# Patient Record
Sex: Male | Born: 1965 | Race: White | Hispanic: No | Marital: Married | Smoking: Never smoker
Health system: Southern US, Community
[De-identification: ages and names within clinical notes are randomized; demographics above are authoritative.]

## PROBLEM LIST (undated history)

## (undated) DIAGNOSIS — Z8619 Personal history of other infectious and parasitic diseases: Secondary | ICD-10-CM

## (undated) DIAGNOSIS — T7840XA Allergy, unspecified, initial encounter: Secondary | ICD-10-CM

## (undated) DIAGNOSIS — K5090 Crohn's disease, unspecified, without complications: Secondary | ICD-10-CM

## (undated) DIAGNOSIS — L719 Rosacea, unspecified: Secondary | ICD-10-CM

## (undated) HISTORY — PX: COLONOSCOPY: SHX174

## (undated) HISTORY — DX: Personal history of other infectious and parasitic diseases: Z86.19

## (undated) HISTORY — DX: Crohn's disease, unspecified, without complications: K50.90

## (undated) HISTORY — DX: Allergy, unspecified, initial encounter: T78.40XA

---

## 1991-01-20 HISTORY — PX: BOWEL RESECTION: SHX1257

## 2007-10-11 DIAGNOSIS — D239 Other benign neoplasm of skin, unspecified: Secondary | ICD-10-CM

## 2007-10-11 HISTORY — DX: Other benign neoplasm of skin, unspecified: D23.9

## 2009-10-31 ENCOUNTER — Other Ambulatory Visit: Payer: Self-pay | Admitting: Internal Medicine

## 2014-08-08 ENCOUNTER — Telehealth: Payer: Self-pay | Admitting: Family Medicine

## 2014-08-08 ENCOUNTER — Ambulatory Visit (INDEPENDENT_AMBULATORY_CARE_PROVIDER_SITE_OTHER): Payer: 59 | Admitting: Family Medicine

## 2014-08-08 ENCOUNTER — Encounter: Payer: Self-pay | Admitting: Family Medicine

## 2014-08-08 ENCOUNTER — Encounter (INDEPENDENT_AMBULATORY_CARE_PROVIDER_SITE_OTHER): Payer: Self-pay

## 2014-08-08 VITALS — BP 124/80 | HR 92 | Temp 99.1°F | Ht 68.0 in | Wt 166.0 lb

## 2014-08-08 DIAGNOSIS — K5090 Crohn's disease, unspecified, without complications: Secondary | ICD-10-CM | POA: Diagnosis not present

## 2014-08-08 DIAGNOSIS — Z720 Tobacco use: Secondary | ICD-10-CM | POA: Diagnosis not present

## 2014-08-08 DIAGNOSIS — N529 Male erectile dysfunction, unspecified: Secondary | ICD-10-CM | POA: Diagnosis not present

## 2014-08-08 DIAGNOSIS — Z0000 Encounter for general adult medical examination without abnormal findings: Secondary | ICD-10-CM | POA: Insufficient documentation

## 2014-08-08 DIAGNOSIS — L719 Rosacea, unspecified: Secondary | ICD-10-CM | POA: Diagnosis not present

## 2014-08-08 DIAGNOSIS — Z13220 Encounter for screening for lipoid disorders: Secondary | ICD-10-CM | POA: Diagnosis not present

## 2014-08-08 LAB — LIPID PANEL
Cholesterol: 154 mg/dL (ref 0–200)
HDL: 53.2 mg/dL (ref >39.00)
LDL Cholesterol: 69 mg/dL (ref 0–99)
NonHDL: 100.8
Total CHOL/HDL Ratio: 3
Triglycerides: 157 mg/dL — ABNORMAL HIGH (ref 0.0–149.0)
VLDL: 31.4 mg/dL (ref 0.0–40.0)

## 2014-08-08 MED ORDER — TADALAFIL 5 MG PO TABS: 5.0000 mg | ORAL_TABLET | Freq: Every day | ORAL | Status: DC

## 2014-08-08 MED ORDER — NICOTINE 21 MG/24HR TD PT24: 21.0000 mg | MEDICATED_PATCH | Freq: Every day | TRANSDERMAL | Status: DC

## 2014-08-08 MED ORDER — SILDENAFIL CITRATE 50 MG PO TABS: 50.0000 mg | ORAL_TABLET | Freq: Every day | ORAL | Status: DC | PRN

## 2014-08-08 NOTE — Telephone Encounter (Signed)
Pt given triage sheet to explain medication change. Sheet given to Latoya/msn

## 2014-08-08 NOTE — Patient Instructions (Signed)
It was nice to see you today.  I have prescribed a medication for you (its at the pharmacy).  Use the patch (21 mg patch) and gum or losenge (2-4 mg) to help you quit using tobacco.  We will be in touch regarding your lab work and your referral to GI.  Follow up annually or sooner if needed.   Take care  Dr. Rica Mast

## 2014-08-08 NOTE — Assessment & Plan Note (Addendum)
Approximate nicotine content of his dip is 5 mg/g. Patient is therefore getting a lot of nicotine daily. Starting patient on 21 mg nicotine patch.  Patient to also use 4 mg gum as well.

## 2014-08-08 NOTE — Assessment & Plan Note (Signed)
Currently well controlled. Patient however has not been seen by GI physician in years. Patient is at high risk colon cancer given his history of Crohn's disease. Placing referral today to gastroenterology so he can establish care and obtain regular screening colonoscopies.

## 2014-08-08 NOTE — Progress Notes (Signed)
Subjective:    Patient ID: Alexander Bishop, male    DOB: [birth year], 49 y.o.   MRN: [id number]  HPI 49 year old male presents to the clinic today to establish care. Patient has a few complaints/concerns today: Tobacco cessation, Erectile dysfunction.   1) Preventative healthcare  Patient has not had a colonoscopy in years and is high risk. Patient is therefore need of colonoscopy.  Given age and family history patient is in need of lipid screening.  2) Tobacco abuse  Patient is a long time user of smokeless tobacco; Patient uses 1 can of dip daily [facility]).  He is aware of the risk and would like to discuss options regarding how to quit today.  He has failed prior attempts at quitting. He previously used nicorette gum.  3) ED  Patient reports a few year history of erectile dysfunction.  Patient reports that he intermittently has difficulty obtaining and maintaining an erection.  He says that this happens about 50% of time.  He has tried agents in the past with success.  No known exacerbating factors.  Patient has no issues with sexual desire/libido.  4) Crohns disease  Patient has history of Crohn's disease and had a partial colectomy in 1993.  He is currently in remission.  He does not have a gastroenterologist and has not received a screening colonoscopy in years.  5) Rosacea   Stable and well-controlled on doxycycline.  PMH, Surgical Hx, Family Hx, Social History reviewed and updated as below.  Past Medical History  Diagnosis Date  . History of chicken pox   . Allergy   . Crohn disease    Past Surgical History  Procedure Laterality Date  . Bowel resection  1993   Family History  Problem Relation Age of Onset  . Hyperlipidemia Father   . Heart disease Father   . Arthritis Paternal Grandmother   . Arthritis Paternal Grandfather    History   Social History  . Marital Status: Married    Spouse Name: N/A  . Number of Children: N/A   . Years of Education: N/A   Social History Main Topics  . Smoking status: Never Smoker   . Smokeless tobacco: Current User    Types: Snuff     Comment: Grizzly wintergreen Long cut; 1 can/day.   . Alcohol Use: 0.0 oz/week    0 Standard drinks or equivalent per week     Comment: Drinks ~ 8 beers on the weekends.   . Drug Use: No  . Sexual Activity: Yes     Comment: Married.    Other Topics Concern  . None   Social History Games developer; Nurse, children's.   Married.   Daughter 71 years old; Going to [employer].    Son 15   Review of Systems Positive for Diarrhea, Erectile dysfunction.  All other systems negative.     Objective:   Physical Exam Filed Vitals:   08/08/14 1413  BP: 124/80  Pulse: 92  Temp: 99.1 F (37.3 C)   Vital signs reviewed. Exam: Constitutional: well nourished, well developed 49 year old male in no acute distress. Eyes: No scleral icterus. No injection of conjunctiva. EOMI.  ENT: NCAT. Oropharynx clear. No exudate. MMM. Normal TMs bilaterally. Mouth: Good dentition. Neck:  Tracheal midline; No lymphadenopathy. No thyromegaly.  Lungs: No increased work of breathing. CTAB. No rales, rhonchi or wheezing.  CV: RRR, no murmur/rub/gallops. No LE edema.  Abdomen/GI: Soft, non-tender; nondistended. No masses or organomegly. No rebound  or guarding. Skin: Warm, dry, intact. No rash.  Psych: Normal mood and affect. AO x 3. Neuro: No focal deficits.     Assessment & Plan:  See Problem List

## 2014-08-08 NOTE — Assessment & Plan Note (Signed)
Unclear etiology at this time. Lack of abrupt onset is against psychogenic causes although this may be playing a role. No physical exam findings suggestive of underlying hypogonadism, thyroid disease. Patient does not have a history of diabetes or hypertension. No clinical evidence of cardio vascular disease as patient has good pulses and exercises on a regular basis without difficulty. Patient has responded previously to phosphodiesterase inhibitors. Given the above, will proceed with empiric treatment at this time. Rx sent for Cialis.

## 2014-08-08 NOTE — Assessment & Plan Note (Signed)
Sending to GI regarding colonoscopy. Lipid panel today.

## 2014-08-08 NOTE — Assessment & Plan Note (Signed)
Well-controlled on doxycycline. Patient to continue current therapy with this medication.

## 2014-08-09 ENCOUNTER — Encounter: Payer: Self-pay | Admitting: *Deleted

## 2014-08-09 ENCOUNTER — Encounter: Payer: Self-pay | Admitting: Family Medicine

## 2014-08-09 LAB — BASIC METABOLIC PANEL WITH GFR
BUN: 9 mg/dL (ref 6–23)
CALCIUM: 9.1 mg/dL (ref 8.4–10.5)
CO2: 25 meq/L (ref 19–32)
CREATININE: 1.12 mg/dL (ref 0.50–1.35)
Chloride: 102 mEq/L (ref 96–112)
GFR, Est African American: >89 mL/min
GFR, Est Non African American: 78 mL/min
Glucose, Bld: 81 mg/dL (ref 70–99)
POTASSIUM: 3.6 meq/L (ref 3.5–5.3)
SODIUM: 140 meq/L (ref 135–145)

## 2014-08-13 ENCOUNTER — Telehealth: Payer: Self-pay

## 2014-08-13 NOTE — Telephone Encounter (Signed)
Pt was called to ask about medical records release form and who was his previous primary. Dr.cook said that his medical records would not be needed at this time.

## 2014-11-30 ENCOUNTER — Encounter: Payer: Self-pay | Admitting: *Deleted

## 2014-12-03 ENCOUNTER — Ambulatory Visit: Payer: 59 | Admitting: Anesthesiology

## 2014-12-03 ENCOUNTER — Encounter
Admission: RE | Disposition: A | Discharge status: Home / Self Care | Payer: Self-pay | Source: Ambulatory Visit | Attending: Gastroenterology

## 2014-12-03 ENCOUNTER — Ambulatory Visit
Admission: RE | Admit: 2014-12-03 | Discharge: 2014-12-03 | Disposition: A | Discharge status: Home / Self Care | Payer: 59 | Source: Ambulatory Visit | Attending: Gastroenterology | Admitting: Gastroenterology

## 2014-12-03 DIAGNOSIS — K5090 Crohn's disease, unspecified, without complications: Secondary | ICD-10-CM | POA: Diagnosis not present

## 2014-12-03 DIAGNOSIS — L719 Rosacea, unspecified: Secondary | ICD-10-CM | POA: Diagnosis not present

## 2014-12-03 DIAGNOSIS — Z79899 Other long term (current) drug therapy: Secondary | ICD-10-CM | POA: Insufficient documentation

## 2014-12-03 DIAGNOSIS — Z1211 Encounter for screening for malignant neoplasm of colon: Secondary | ICD-10-CM | POA: Diagnosis present

## 2014-12-03 DIAGNOSIS — Z980 Intestinal bypass and anastomosis status: Secondary | ICD-10-CM | POA: Insufficient documentation

## 2014-12-03 HISTORY — PX: COLONOSCOPY WITH PROPOFOL: SHX5780

## 2014-12-03 HISTORY — DX: Rosacea, unspecified: L71.9

## 2014-12-03 SURGERY — COLONOSCOPY WITH PROPOFOL
Anesthesia: General

## 2014-12-03 MED ORDER — PROPOFOL 10 MG/ML IV BOLUS
INTRAVENOUS | Status: DC | PRN
  Administered 2014-12-03 (×2): 50 mg via INTRAVENOUS

## 2014-12-03 MED ORDER — SODIUM CHLORIDE 0.9 % IV SOLN
INTRAVENOUS | Status: DC
  Administered 2014-12-03 (×2): via INTRAVENOUS

## 2014-12-03 MED ORDER — MIDAZOLAM HCL 5 MG/5ML IJ SOLN
INTRAMUSCULAR | Status: DC | PRN
  Administered 2014-12-03: 1 mg via INTRAVENOUS

## 2014-12-03 MED ORDER — LIDOCAINE HCL (CARDIAC) 20 MG/ML IV SOLN
INTRAVENOUS | Status: DC | PRN
  Administered 2014-12-03: 30 mg via INTRAVENOUS

## 2014-12-03 MED ORDER — FENTANYL CITRATE (PF) 100 MCG/2ML IJ SOLN
INTRAMUSCULAR | Status: DC | PRN
  Administered 2014-12-03: 50 ug via INTRAVENOUS

## 2014-12-03 MED ORDER — PROPOFOL 500 MG/50ML IV EMUL
INTRAVENOUS | Status: DC | PRN
  Administered 2014-12-03: 160 ug/kg/min via INTRAVENOUS

## 2014-12-03 NOTE — Transfer of Care (Signed)
Immediate Anesthesia Transfer of Care Note  Patient: Alexander Bishop  Procedure(s) Performed: Procedure(s): COLONOSCOPY WITH PROPOFOL (N/A)  Patient Location: PACU and Short Stay  Anesthesia Type:General  Level of Consciousness: sedated and patient cooperative  Airway & Oxygen Therapy: Patient Spontanous Breathing and Patient connected to nasal cannula oxygen  Post-op Assessment: Report given to RN and Post -op Vital signs reviewed and stable  Post vital signs: Reviewed and stable  Last Vitals: There were no vitals filed for this visit.  Complications: No apparent anesthesia complications

## 2014-12-03 NOTE — H&P (Signed)
Outpatient short stay form Pre-procedure 12/03/2014 2:11 PM Lollie Sails MD  Primary Physician: Dr. Truddie Coco  Reason for visit:  Colonoscopy  History of present illness:  Patient is a 49 year old male presenting today for colonoscopy in regards colon cancer screening. He has unusual history in that he was previously diagnosed with Crohn's disease however underwent a partial colectomy with ileal resection in 1993. Since that time he has had stools but no further symptoms in regards to Crohn's disease. There is no abdominal pain or mucousy stools. He takes no maintenance medications. He tolerated his prep well. He takes no aspirin or blood thinning products.    Current facility-administered medications:  .  0.9 %  sodium chloride infusion, , Intravenous, Continuous, Lollie Sails, MD, Last Rate: 20 mL/hr at 12/03/14 1347  Prescriptions prior to admission  Medication Sig Dispense Refill Last Dose  . doxycycline (ORACEA) 40 MG capsule Take 40 mg by mouth every morning.   Taking  . nicotine (NICODERM CQ - DOSED IN MG/24 HOURS) 21 mg/24hr patch Place 1 patch (21 mg total) onto the skin daily. 28 patch 0   . tadalafil (CIALIS) 5 MG tablet Take 1 tablet (5 mg total) by mouth daily. 30 tablet 3      No Known Allergies   Past Medical History  Diagnosis Date  . History of chicken pox   . Allergy   . Crohn disease ([place])   . Crohn's disease ([place])   . Rosacea     Review of systems:      Physical Exam    Heart and lungs: Regular rate and rhythm without rub or gallop, lungs are bilaterally clear.    HEENT: Normocephalic atraumatic eyes are anicteric    Other:     Pertinant exam for procedure: Soft nontender nondistended bowel sounds positive normoactive.    Planned proceedures: Colonoscopy and indicated procedures. I have discussed the risks benefits and complications of procedures to include not limited to bleeding, infection, perforation and the risk of sedation and the  patient wishes to proceed.    Lollie Sails, MD Gastroenterology 12/03/2014  2:11 PM

## 2014-12-03 NOTE — Op Note (Addendum)
[facility] Gastroenterology Patient Name: Destyn Schuyler Procedure Date: 12/03/2014 2:22 PM MRN: [id number] Account #: [id number] Date of Birth: 1965/10/28 Admit Type: Outpatient Age: 49 Room: [facility] ENDO ROOM 3 Gender: Male Note Status: Supervisor Override Procedure:         Colonoscopy Indications:       Screening for colorectal malignant neoplasm, Personal                     history of Crohn's disease Providers:         Lollie Sails, MD Referring MD:      Barnie Del. Lacinda Axon (Referring MD) Medicines:         Monitored Anesthesia Care Complications:     No immediate complications. Procedure:         Pre-Anesthesia Assessment:                    - ASA Grade Assessment: II - A patient with mild systemic                     disease.                    After obtaining informed consent, the colonoscope was                     passed under direct vision. Throughout the procedure, the                     patient's blood pressure, pulse, and oxygen saturations                     were monitored continuously. The Colonoscope was                     introduced through the anus and advanced to the the                     ileocolonic anastomosis. The colonoscopy was performed                     without difficulty. The quality of the bowel preparation                     was fair. The patient tolerated the procedure well. Findings:      There was evidence of a prior end-to-side ileo-colonic anastomosis in       the ascending colon. This was patent. This was characterized by healthy       appearing mucosa. I was unable to access the neoterminal ileum due to       the angulation, however ther area was not inflamed. No evidence of       sutures or staples.      A diffuse area of mildly granular mucosa was found in the entire colon.       Biopsies were taken with a cold forceps for histology. Biopsies were       taken with a cold forceps for histology. Biopsies for  histology were       taken with a cold forceps from the right colon and left colon for       evaluation of microscopic colitis.      the anal orifice showed multiple skin tags, with possible old scaring in       the posterior midline, but no evidence of  filtula or other lesions.      retroflexion in the rectal vault was attempted, but not completed due to       narrow vault. Impression:        - Patent end-to-side ileo-colonic anastomosis,                     characterized by healthy appearing mucosa.                    - Granularity in the entire examined colon. Biopsied. Recommendation:    - Await pathology results. Procedure Code(s): --- Professional ---                    [phone], Colonoscopy, flexible; with biopsy, single or                     multiple Diagnosis Code(s): --- Professional ---                    Z12.11, Encounter for screening for malignant neoplasm of                     colon                    Z98.0, Intestinal bypass and anastomosis status                    K63.89, Other specified diseases of intestine                    Z87.19, Personal history of other diseases of the                     digestive system CPT copyright 2014 American Medical Association. All rights reserved. The codes documented in this report are preliminary and upon coder review may  be revised to meet current compliance requirements. Lollie Sails, MD 12/03/2014 3:05:08 PM This report has been signed electronically. Number of Addenda: 0 Note Initiated On: 12/03/2014 2:22 PM Scope Withdrawal Time: 0 hours 17 minutes 22 seconds  Total Procedure Duration: 0 hours 24 minutes 44 seconds       [facility]

## 2014-12-03 NOTE — Anesthesia Preprocedure Evaluation (Signed)
Anesthesia Evaluation  Patient identified by MRN, date of birth, ID band Patient awake    Reviewed: Allergy & Precautions, H&P , NPO status , Patient's Chart, lab work & pertinent test results, reviewed documented beta blocker date and time   Airway Mallampati: II  TM Distance: >3 FB Neck ROM: full    Dental no notable dental hx.    Pulmonary neg pulmonary ROS,    Pulmonary exam normal breath sounds clear to auscultation       Cardiovascular Exercise Tolerance: Good negative cardio ROS   Rhythm:regular Rate:Normal     Neuro/Psych negative neurological ROS  negative psych ROS   GI/Hepatic negative GI ROS, Neg liver ROS,   Endo/Other  negative endocrine ROS  Renal/GU negative Renal ROS  negative genitourinary   Musculoskeletal   Abdominal   Peds  Hematology negative hematology ROS (+)   Anesthesia Other Findings   Reproductive/Obstetrics negative OB ROS                             Anesthesia Physical Anesthesia Plan  ASA: II  Anesthesia Plan: General   Post-op Pain Management:    Induction:   Airway Management Planned:   Additional Equipment:   Intra-op Plan:   Post-operative Plan:   Informed Consent: I have reviewed the patients History and Physical, chart, labs and discussed the procedure including the risks, benefits and alternatives for the proposed anesthesia with the patient or authorized representative who has indicated his/her understanding and acceptance.   Dental Advisory Given  Plan Discussed with: CRNA  Anesthesia Plan Comments:         Anesthesia Quick Evaluation

## 2014-12-05 ENCOUNTER — Encounter: Payer: Self-pay | Admitting: Gastroenterology

## 2014-12-05 LAB — SURGICAL PATHOLOGY

## 2014-12-11 NOTE — Anesthesia Postprocedure Evaluation (Signed)
Anesthesia Post Note  Patient: Alexander Bishop  Procedure(s) Performed: Procedure(s) (LRB): COLONOSCOPY WITH PROPOFOL (N/A)  Patient location during evaluation: Endoscopy Anesthesia Type: General Level of consciousness: awake and alert Pain management: pain level controlled Vital Signs Assessment: post-procedure vital signs reviewed and stable Respiratory status: spontaneous breathing, nonlabored ventilation, respiratory function stable and patient connected to nasal cannula oxygen Cardiovascular status: blood pressure returned to baseline and stable Postop Assessment: No signs of nausea or vomiting Anesthetic complications: no    Last Vitals:  Filed Vitals:   12/03/14 1520 12/03/14 1530  BP: 127/87 137/94  Pulse: 74 68  Temp:    Resp: 17 15    Last Pain:  Filed Vitals:   12/04/14 0804  PainSc: 0-No pain                 Molli Barrows

## 2015-08-12 ENCOUNTER — Ambulatory Visit (INDEPENDENT_AMBULATORY_CARE_PROVIDER_SITE_OTHER): Payer: 59 | Admitting: Family Medicine

## 2015-08-12 ENCOUNTER — Encounter: Payer: 59 | Admitting: Family Medicine

## 2015-08-12 ENCOUNTER — Encounter: Payer: Self-pay | Admitting: Family Medicine

## 2015-08-12 VITALS — BP 120/86 | HR 82 | Temp 98.2°F | Ht 68.0 in | Wt 159.0 lb

## 2015-08-12 DIAGNOSIS — K50918 Crohn's disease, unspecified, with other complication: Secondary | ICD-10-CM

## 2015-08-12 DIAGNOSIS — Z130 Encounter for screening for diseases of the blood and blood-forming organs and certain disorders involving the immune mechanism: Secondary | ICD-10-CM | POA: Diagnosis not present

## 2015-08-12 DIAGNOSIS — Z13220 Encounter for screening for lipoid disorders: Secondary | ICD-10-CM

## 2015-08-12 DIAGNOSIS — Z131 Encounter for screening for diabetes mellitus: Secondary | ICD-10-CM

## 2015-08-12 DIAGNOSIS — Z0000 Encounter for general adult medical examination without abnormal findings: Secondary | ICD-10-CM

## 2015-08-12 LAB — CBC
HCT: 49.1 % (ref 39.0–52.0)
Hemoglobin: 17 g/dL (ref 13.0–17.0)
MCHC: 34.5 g/dL (ref 30.0–36.0)
MCV: 88.5 fl (ref 78.0–100.0)
PLATELETS: 257 10*3/uL (ref 150.0–400.0)
RBC: 5.54 Mil/uL (ref 4.22–5.81)
RDW: 13.6 % (ref 11.5–15.5)
WBC: 7.1 10*3/uL (ref 4.0–10.5)

## 2015-08-12 LAB — LIPID PANEL
CHOL/HDL RATIO: 3
CHOLESTEROL: 148 mg/dL (ref 0–200)
HDL: 47.2 mg/dL (ref >39.00)
LDL Cholesterol: 84 mg/dL (ref 0–99)
NonHDL: 100.68
TRIGLYCERIDES: 83 mg/dL (ref 0.0–149.0)
VLDL: 16.6 mg/dL (ref 0.0–40.0)

## 2015-08-12 LAB — HEMOGLOBIN A1C: HEMOGLOBIN A1C: 4.8 % (ref 4.6–6.5)

## 2015-08-12 LAB — COMPREHENSIVE METABOLIC PANEL
ALBUMIN: 4.1 g/dL (ref 3.5–5.2)
ALT: 20 U/L (ref 0–53)
AST: 22 U/L (ref 0–37)
Alkaline Phosphatase: 66 U/L (ref 39–117)
BILIRUBIN TOTAL: 1.2 mg/dL (ref 0.2–1.2)
BUN: 14 mg/dL (ref 6–23)
CALCIUM: 8.9 mg/dL (ref 8.4–10.5)
CO2: 23 mEq/L (ref 19–32)
CREATININE: 1.13 mg/dL (ref 0.40–1.50)
Chloride: 105 mEq/L (ref 96–112)
GFR: 73.46 mL/min (ref >60.00)
Glucose, Bld: 95 mg/dL (ref 70–99)
Potassium: 3.6 mEq/L (ref 3.5–5.1)
SODIUM: 136 meq/L (ref 135–145)
Total Protein: 6.8 g/dL (ref 6.0–8.3)

## 2015-08-12 MED ORDER — TADALAFIL 5 MG PO TABS: 5.0000 mg | ORAL_TABLET | Freq: Every day | ORAL | 3 refills | Status: DC

## 2015-08-12 NOTE — Progress Notes (Signed)
Subjective:  Patient ID: Alexander Bishop, male    DOB: 11/25/1965  Age: 50 y.o. MRN: [id number]  CC: Annual physical  HPI Alexander Bishop is a 50 y.o. male presents to the clinic today for an annual physical exam.  Preventative Healthcare  Colonoscopy: Up to date. 2016 (See care everywhere).  Immunizations  Tetanus - Declined.  Pneumococcal - Not indicated.  Prostate cancer screening: No needed at this time.  Labs: Screening labs today - see orders.   Alcohol use: See below.  Smoking/tobacco use: Current smokeless tobacco user.  STD/HIV testing: Declines.  Regular dental exams: Yes.   Wears seat belt: Yes.   PMH, Surgical Hx, Family Hx, Social History reviewed and updated as below.  Past Medical History:  Diagnosis Date  . Allergy   . Crohn disease ([place])   . Crohn's disease ([place])   . History of chicken pox   . Rosacea    Past Surgical History:  Procedure Laterality Date  . BOWEL RESECTION  1993  . COLONOSCOPY    . COLONOSCOPY WITH PROPOFOL N/A 12/03/2014   Procedure: COLONOSCOPY WITH PROPOFOL;  Surgeon: Lollie Sails, MD;  Location: [facility] ENDOSCOPY;  Service: Endoscopy;  Laterality: N/A;   Family History  Problem Relation Age of Onset  . Hyperlipidemia Father   . Heart disease Father   . Arthritis Paternal Grandmother   . Arthritis Paternal Grandfather    Social History  Substance Use Topics  . Smoking status: Never Smoker  . Smokeless tobacco: Current User    Types: Snuff     Comment: Grizzly wintergreen Long cut; 1 can/day.   . Alcohol use 0.0 oz/week     Comment: Drinks ~ 8 beers on the weekends.    Review of Systems  Psychiatric/Behavioral:       Stress, memory difficulty.  All other systems reviewed and are negative.  Objective:   Today's Vitals: BP 120/86 (BP Location: Left Arm, Patient Position: Sitting, Cuff Size: Normal)   Pulse 82   Temp 98.2 F (36.8 C) (Oral)   Ht 5' 8"  (1.727 m)   Wt 159 lb (72.1 kg)   SpO2 99%    BMI 24.18 kg/m   Physical Exam  Constitutional: He is oriented to person, place, and time. He appears well-developed and well-nourished. No distress.  HENT:  Head: Normocephalic and atraumatic.  Nose: Nose normal.  Mouth/Throat: Oropharynx is clear and moist. No oropharyngeal exudate.  Eyes: Conjunctivae are normal. No scleral icterus.  Neck: Neck supple. No thyromegaly present.  Cardiovascular: Normal rate and regular rhythm.   No murmur heard. Pulmonary/Chest: Effort normal and breath sounds normal. He has no wheezes. He has no rales.  Abdominal: Soft. He exhibits no distension. There is no tenderness. There is no rebound and no guarding.  Musculoskeletal: Normal range of motion. He exhibits no edema.  Lymphadenopathy:    He has no cervical adenopathy.  Neurological: He is alert and oriented to person, place, and time.  Skin: Skin is warm and dry. No rash noted.  Psychiatric: He has a normal mood and affect.  Vitals reviewed.  Assessment & Plan:   Problem List Items Addressed This Visit    Crohn's disease ([place])   Relevant Orders   Comp Met (CMET)   Preventative health care    Declines Tdap and HIV/STD screening. Has had colonoscopy. Screening labs today. Doing well.        Other Visit Diagnoses    Screening for deficiency anemia    -  Primary   Relevant Orders   CBC   Screening, lipid       Relevant Orders   Lipid Profile   Screening for diabetes mellitus       Relevant Orders   HgB A1c      Outpatient Encounter Prescriptions as of 08/12/2015  Medication Sig  . doxycycline (ORACEA) 40 MG capsule Take 40 mg by mouth every morning.  . nicotine (NICODERM CQ - DOSED IN MG/24 HOURS) 21 mg/24hr patch Place 1 patch (21 mg total) onto the skin daily.  . tadalafil (CIALIS) 5 MG tablet Take 1 tablet (5 mg total) by mouth daily.  . [DISCONTINUED] tadalafil (CIALIS) 5 MG tablet Take 1 tablet (5 mg total) by mouth daily.   No facility-administered encounter medications  on file as of 08/12/2015.     Follow-up: Return in about 1 year (around 08/11/2016).  [place]

## 2015-08-12 NOTE — Patient Instructions (Signed)
We will call with your lab results.  Try and quit the dip.  Follow up annually.  Take care  Dr. Lacinda Axon   Health Maintenance, Male A healthy lifestyle and preventative care can promote health and wellness.  Maintain regular health, dental, and eye exams.  Eat a healthy diet. Foods like vegetables, fruits, whole grains, low-fat dairy products, and lean protein foods contain the nutrients you need and are low in calories. Decrease your intake of foods high in solid fats, added sugars, and salt. Get information about a proper diet from your health care provider, if necessary.  Regular physical exercise is one of the most important things you can do for your health. Most adults should get at least 150 minutes of moderate-intensity exercise (any activity that increases your heart rate and causes you to sweat) each week. In addition, most adults need muscle-strengthening exercises on 2 or more days a week.   Maintain a healthy weight. The body mass index (BMI) is a screening tool to identify possible weight problems. It provides an estimate of body fat based on height and weight. Your health care provider can find your BMI and can help you achieve or maintain a healthy weight. For males 20 years and older:  A BMI below 18.5 is considered underweight.  A BMI of 18.5 to 24.9 is normal.  A BMI of 25 to 29.9 is considered overweight.  A BMI of 30 and above is considered obese.  Maintain normal blood lipids and cholesterol by exercising and minimizing your intake of saturated fat. Eat a balanced diet with plenty of fruits and vegetables. Blood tests for lipids and cholesterol should begin at age 42 and be repeated every 5 years. If your lipid or cholesterol levels are high, you are over age 64, or you are at high risk for heart disease, you may need your cholesterol levels checked more frequently.Ongoing high lipid and cholesterol levels should be treated with medicines if diet and exercise are not  working.  If you smoke, find out from your health care provider how to quit. If you do not use tobacco, do not start.  Lung cancer screening is recommended for adults aged 41-80 years who are at high risk for developing lung cancer because of a history of smoking. A yearly low-dose CT scan of the lungs is recommended for people who have at least a 30-pack-year history of smoking and are current smokers or have quit within the past 15 years. A pack year of smoking is smoking an average of 1 pack of cigarettes a day for 1 year (for example, a 30-pack-year history of smoking could mean smoking 1 pack a day for 30 years or 2 packs a day for 15 years). Yearly screening should continue until the smoker has stopped smoking for at least 15 years. Yearly screening should be stopped for people who develop a health problem that would prevent them from having lung cancer treatment.  If you choose to drink alcohol, do not have more than 2 drinks per day. One drink is considered to be 12 oz (360 mL) of beer, 5 oz (150 mL) of wine, or 1.5 oz (45 mL) of liquor.  Avoid the use of street drugs. Do not share needles with anyone. Ask for help if you need support or instructions about stopping the use of drugs.  High blood pressure causes heart disease and increases the risk of stroke. High blood pressure is more likely to develop in:  People who have blood  pressure in the end of the normal range (100-139/85-89 mm Hg).  People who are overweight or obese.  People who are African American.  If you are 55-58 years of age, have your blood pressure checked every 3-5 years. If you are 63 years of age or older, have your blood pressure checked every year. You should have your blood pressure measured twice--once when you are at a hospital or clinic, and once when you are not at a hospital or clinic. Record the average of the two measurements. To check your blood pressure when you are not at a hospital or clinic, you can  use:  An automated blood pressure machine at a pharmacy.  A home blood pressure monitor.  If you are 17-28 years old, ask your health care provider if you should take aspirin to prevent heart disease.  Diabetes screening involves taking a blood sample to check your fasting blood sugar level. This should be done once every 3 years after age 21 if you are at a normal weight and without risk factors for diabetes. Testing should be considered at a younger age or be carried out more frequently if you are overweight and have at least 1 risk factor for diabetes.  Colorectal cancer can be detected and often prevented. Most routine colorectal cancer screening begins at the age of 75 and continues through age 75. However, your health care provider may recommend screening at an earlier age if you have risk factors for colon cancer. On a yearly basis, your health care provider may provide home test kits to check for hidden blood in the stool. A small camera at the end of a tube may be used to directly examine the colon (sigmoidoscopy or colonoscopy) to detect the earliest forms of colorectal cancer. Talk to your health care provider about this at age 37 when routine screening begins. A direct exam of the colon should be repeated every 5-10 years through age 45, unless early forms of precancerous polyps or small growths are found.  People who are at an increased risk for hepatitis B should be screened for this virus. You are considered at high risk for hepatitis B if:  You were born in a country where hepatitis B occurs often. Talk with your health care provider about which countries are considered high risk.  Your parents were born in a high-risk country and you have not received a shot to protect against hepatitis B (hepatitis B vaccine).  You have HIV or AIDS.  You use needles to inject street drugs.  You live with, or have sex with, someone who has hepatitis B.  You are a man who has sex with other  men (MSM).  You get hemodialysis treatment.  You take certain medicines for conditions like cancer, organ transplantation, and autoimmune conditions.  Hepatitis C blood testing is recommended for all people born from [birth year] through 1965 and any individual with known risk factors for hepatitis C.  Healthy men should no longer receive prostate-specific antigen (PSA) blood tests as part of routine cancer screening. Talk to your health care provider about prostate cancer screening.  Testicular cancer screening is not recommended for adolescents or adult males who have no symptoms. Screening includes self-exam, a health care provider exam, and other screening tests. Consult with your health care provider about any symptoms you have or any concerns you have about testicular cancer.  Practice safe sex. Use condoms and avoid high-risk sexual practices to reduce the spread of sexually transmitted infections (STIs).  You should be screened for STIs, including gonorrhea and chlamydia if:  You are sexually active and are younger than 24 years.  You are older than 24 years, and your health care provider tells you that you are at risk for this type of infection.  Your sexual activity has changed since you were last screened, and you are at an increased risk for chlamydia or gonorrhea. Ask your health care provider if you are at risk.  If you are at risk of being infected with HIV, it is recommended that you take a prescription medicine daily to prevent HIV infection. This is called pre-exposure prophylaxis (PrEP). You are considered at risk if:  You are a man who has sex with other men (MSM).  You are a heterosexual man who is sexually active with multiple partners.  You take drugs by injection.  You are sexually active with a partner who has HIV.  Talk with your health care provider about whether you are at high risk of being infected with HIV. If you choose to begin PrEP, you should first be tested  for HIV. You should then be tested every 3 months for as long as you are taking PrEP.  Use sunscreen. Apply sunscreen liberally and repeatedly throughout the day. You should seek shade when your shadow is shorter than you. Protect yourself by wearing long sleeves, pants, a wide-brimmed hat, and sunglasses year round whenever you are outdoors.  Tell your health care provider of new moles or changes in moles, especially if there is a change in shape or color. Also, tell your health care provider if a mole is larger than the size of a pencil eraser.  A one-time screening for abdominal aortic aneurysm (AAA) and surgical repair of large AAAs by ultrasound is recommended for men aged 26-75 years who are current or former smokers.  Stay current with your vaccines (immunizations).   This information is not intended to replace advice given to you by your health care provider. Make sure you discuss any questions you have with your health care provider.   Document Released: 07/04/2007 Document Revised: 01/26/2014 Document Reviewed: 06/02/2010 Elsevier Interactive Patient Education [employer].

## 2015-08-12 NOTE — Assessment & Plan Note (Signed)
Declines Tdap and HIV/STD screening. Has had colonoscopy. Screening labs today. Doing well.

## 2016-02-04 DIAGNOSIS — H524 Presbyopia: Secondary | ICD-10-CM | POA: Diagnosis not present

## 2016-02-04 DIAGNOSIS — H5213 Myopia, bilateral: Secondary | ICD-10-CM | POA: Diagnosis not present

## 2016-02-13 ENCOUNTER — Ambulatory Visit (INDEPENDENT_AMBULATORY_CARE_PROVIDER_SITE_OTHER): Payer: 59 | Admitting: Family Medicine

## 2016-02-13 ENCOUNTER — Ambulatory Visit (INDEPENDENT_AMBULATORY_CARE_PROVIDER_SITE_OTHER): Payer: 59

## 2016-02-13 ENCOUNTER — Encounter: Payer: Self-pay | Admitting: Family Medicine

## 2016-02-13 VITALS — BP 127/86 | HR 83 | Temp 98.5°F | Wt 165.6 lb

## 2016-02-13 DIAGNOSIS — M7989 Other specified soft tissue disorders: Secondary | ICD-10-CM | POA: Diagnosis not present

## 2016-02-13 DIAGNOSIS — M25572 Pain in left ankle and joints of left foot: Secondary | ICD-10-CM | POA: Diagnosis not present

## 2016-02-13 DIAGNOSIS — Z23 Encounter for immunization: Secondary | ICD-10-CM | POA: Diagnosis not present

## 2016-02-13 NOTE — Progress Notes (Signed)
Subjective:  Patient ID: Alexander Bishop, male    DOB: 14-Oct-1965  Age: 51 y.o. MRN: [id number]  CC: Left ankle pain, swelling  HPI:  51 year old male presents with the above complaint.  Patient states that on Friday he noticed some soreness of his left ankle. He has been exercising regularly and frequently since the year. He was golfing on Saturday. On Sunday he noticed some swelling of his ankle, predominantly around the medial malleolus. He does not recall any fall, trauma, injury. He has not twisted his ankle while exercising per his report. He has been elevating and using over-the-counter ibuprofen without improvement. Pain is mild. No known exacerbating or relieving factors. No other associated symptoms. No other complaints at this time.  Social Hx   Social History   Social History  . Marital status: Married    Spouse name: N/A  . Number of children: N/A  . Years of education: N/A   Occupational History  . Government social research officer    Social History Main Topics  . Smoking status: Never Smoker  . Smokeless tobacco: Current User    Types: Snuff     Comment: Grizzly wintergreen Long cut; 1 can/day.   . Alcohol use 0.0 oz/week     Comment: Drinks ~ 8 beers on the weekends.   . Drug use: No  . Sexual activity: Yes     Comment: Married.    Other Topics Concern  . None   Social History Games developer; Nurse, children's.   Married.   Daughter 77 years old; Going to [employer].    Son 15    Review of Systems  Constitutional: Negative.   Musculoskeletal:       Ankle pain, swelling.   Objective:  BP 127/86   Pulse 83   Temp 98.5 F (36.9 C) (Oral)   Wt 165 lb 9.6 oz (75.1 kg)   SpO2 97%   BMI 25.18 kg/m   BP/Weight 02/13/2016 08/12/2015 57/84/6962  Systolic BP 952 841 324  Diastolic BP 86 86 94  Wt. (Lbs) 165.6 159 -  BMI 25.18 24.18 -   Physical Exam  Constitutional: He is oriented to person, place, and time. He appears well-developed. No distress.    Pulmonary/Chest: Effort normal.  Musculoskeletal:  Left ankle - swelling noted around the medial malleolus. Bruising also noted. Normal range of motion. Mildly tender to palpation. Distal pulses intact.  Neurological: He is alert and oriented to person, place, and time.  Psychiatric: He has a normal mood and affect.  Vitals reviewed.  Lab Results  Component Value Date   WBC 7.1 08/12/2015   HGB 17.0 08/12/2015   HCT 49.1 08/12/2015   PLT 257.0 08/12/2015   GLUCOSE 95 08/12/2015   CHOL 148 08/12/2015   TRIG 83.0 08/12/2015   HDL 47.20 08/12/2015   LDLCALC 84 08/12/2015   ALT 20 08/12/2015   AST 22 08/12/2015   NA 136 08/12/2015   K 3.6 08/12/2015   CL 105 08/12/2015   CREATININE 1.13 08/12/2015   BUN 14 08/12/2015   CO2 23 08/12/2015   HGBA1C 4.8 08/12/2015   Assessment & Plan:   Problem List Items Addressed This Visit    Acute left ankle pain - Primary    New acute problem. Uncertain etiology/prognosis at this time. Suspect ligamentous or soft tissue injury. He does not recall any injury. Given the symptomatic swelling and patient concern, obtaining x-ray. Advised RICE and PRN Ibuprofen.  Relevant Orders   DG Ankle Complete Left     Follow-up: PRN  [place]

## 2016-02-13 NOTE — Assessment & Plan Note (Signed)
New acute problem. Uncertain etiology/prognosis at this time. Suspect ligamentous or soft tissue injury. He does not recall any injury. Given the symptomatic swelling and patient concern, obtaining x-ray. Advised RICE and PRN Ibuprofen.

## 2016-02-13 NOTE — Patient Instructions (Signed)
Rest, Ice, Compression, Elevation.  Ibuprofen as needed.   We will call with your xray results.  Take care  Dr. Lacinda Axon

## 2016-02-24 ENCOUNTER — Telehealth: Payer: Self-pay | Admitting: *Deleted

## 2016-02-24 ENCOUNTER — Other Ambulatory Visit: Payer: Self-pay | Admitting: Family Medicine

## 2016-02-24 MED ORDER — SILDENAFIL CITRATE 50 MG PO TABS: 50.0000 mg | ORAL_TABLET | Freq: Every day | ORAL | 6 refills | Status: DC | PRN

## 2016-02-24 NOTE — Telephone Encounter (Signed)
Pt on Cialis.

## 2016-02-24 NOTE — Telephone Encounter (Signed)
Pt has requested to have medication refill for the generic of Viagra Pharmacy [facility]

## 2016-02-24 NOTE — Telephone Encounter (Signed)
Recommendations?

## 2016-02-24 NOTE — Telephone Encounter (Signed)
Provider sending in medications.

## 2016-02-24 NOTE — Telephone Encounter (Signed)
Okay to refill? 

## 2016-03-06 DIAGNOSIS — H16042 Marginal corneal ulcer, left eye: Secondary | ICD-10-CM | POA: Diagnosis not present

## 2016-03-09 DIAGNOSIS — H16042 Marginal corneal ulcer, left eye: Secondary | ICD-10-CM | POA: Diagnosis not present

## 2016-03-16 DIAGNOSIS — H00022 Hordeolum internum right lower eyelid: Secondary | ICD-10-CM | POA: Diagnosis not present

## 2016-08-06 DIAGNOSIS — D2371 Other benign neoplasm of skin of right lower limb, including hip: Secondary | ICD-10-CM | POA: Diagnosis not present

## 2016-08-06 DIAGNOSIS — D229 Melanocytic nevi, unspecified: Secondary | ICD-10-CM | POA: Diagnosis not present

## 2016-08-06 DIAGNOSIS — D485 Neoplasm of uncertain behavior of skin: Secondary | ICD-10-CM | POA: Diagnosis not present

## 2016-08-06 DIAGNOSIS — B360 Pityriasis versicolor: Secondary | ICD-10-CM | POA: Diagnosis not present

## 2016-08-06 DIAGNOSIS — L578 Other skin changes due to chronic exposure to nonionizing radiation: Secondary | ICD-10-CM | POA: Diagnosis not present

## 2016-08-06 DIAGNOSIS — Z1283 Encounter for screening for malignant neoplasm of skin: Secondary | ICD-10-CM | POA: Diagnosis not present

## 2016-08-06 DIAGNOSIS — L821 Other seborrheic keratosis: Secondary | ICD-10-CM | POA: Diagnosis not present

## 2016-08-06 DIAGNOSIS — L738 Other specified follicular disorders: Secondary | ICD-10-CM | POA: Diagnosis not present

## 2016-08-12 ENCOUNTER — Ambulatory Visit (INDEPENDENT_AMBULATORY_CARE_PROVIDER_SITE_OTHER): Payer: 59 | Admitting: Family Medicine

## 2016-08-12 ENCOUNTER — Encounter: Payer: Self-pay | Admitting: Family Medicine

## 2016-08-12 VITALS — BP 108/82 | HR 84 | Temp 98.4°F | Ht 68.0 in | Wt 166.5 lb

## 2016-08-12 DIAGNOSIS — Z0000 Encounter for general adult medical examination without abnormal findings: Secondary | ICD-10-CM | POA: Diagnosis not present

## 2016-08-12 DIAGNOSIS — K5090 Crohn's disease, unspecified, without complications: Secondary | ICD-10-CM

## 2016-08-12 LAB — COMPREHENSIVE METABOLIC PANEL
ALK PHOS: 59 U/L (ref 39–117)
ALT: 19 U/L (ref 0–53)
AST: 20 U/L (ref 0–37)
Albumin: 3.9 g/dL (ref 3.5–5.2)
BUN: 11 mg/dL (ref 6–23)
CO2: 26 mEq/L (ref 19–32)
CREATININE: 1.18 mg/dL (ref 0.40–1.50)
Calcium: 8.8 mg/dL (ref 8.4–10.5)
Chloride: 107 mEq/L (ref 96–112)
GFR: 69.59 mL/min (ref >60.00)
GLUCOSE: 95 mg/dL (ref 70–99)
Potassium: 3.8 mEq/L (ref 3.5–5.1)
SODIUM: 138 meq/L (ref 135–145)
TOTAL PROTEIN: 6.5 g/dL (ref 6.0–8.3)
Total Bilirubin: 0.7 mg/dL (ref 0.2–1.2)

## 2016-08-12 LAB — LIPID PANEL
Cholesterol: 163 mg/dL (ref 0–200)
HDL: 51.9 mg/dL (ref >39.00)
LDL Cholesterol: 92 mg/dL (ref 0–99)
NONHDL: 111.37
Total CHOL/HDL Ratio: 3
Triglycerides: 98 mg/dL (ref 0.0–149.0)
VLDL: 19.6 mg/dL (ref 0.0–40.0)

## 2016-08-12 LAB — CBC
HCT: 46 % (ref 39.0–52.0)
Hemoglobin: 15.8 g/dL (ref 13.0–17.0)
MCHC: 34.2 g/dL (ref 30.0–36.0)
MCV: 90 fl (ref 78.0–100.0)
Platelets: 243 10*3/uL (ref 150.0–400.0)
RBC: 5.11 Mil/uL (ref 4.22–5.81)
RDW: 13.7 % (ref 11.5–15.5)
WBC: 6.8 10*3/uL (ref 4.0–10.5)

## 2016-08-12 LAB — PSA: PSA: 0.51 ng/mL (ref 0.10–4.00)

## 2016-08-12 NOTE — Patient Instructions (Signed)
GI referral.  Follow up annually.  Cut back on the dip.  Take care  Dr. Lacinda Axon    Health Maintenance, Male A healthy lifestyle and preventive care is important for your health and wellness. Ask your health care provider about what schedule of regular examinations is right for you. What should I know about weight and diet? Eat a Healthy Diet  Eat plenty of vegetables, fruits, whole grains, low-fat dairy products, and lean protein.  Do not eat a lot of foods high in solid fats, added sugars, or salt.  Maintain a Healthy Weight Regular exercise can help you achieve or maintain a healthy weight. You should:  Do at least 150 minutes of exercise each week. The exercise should increase your heart rate and make you sweat (moderate-intensity exercise).  Do strength-training exercises at least twice a week.  Watch Your Levels of Cholesterol and Blood Lipids  Have your blood tested for lipids and cholesterol every 5 years starting at 49 years of age. If you are at high risk for heart disease, you should start having your blood tested when you are 49 years old. You may need to have your cholesterol levels checked more often if: ? Your lipid or cholesterol levels are high. ? You are older than 49 years of age. ? You are at high risk for heart disease.  What should I know about cancer screening? Many types of cancers can be detected early and may often be prevented. Lung Cancer  You should be screened every year for lung cancer if: ? You are a current smoker who has smoked for at least 30 years. ? You are a former smoker who has quit within the past 15 years.  Talk to your health care provider about your screening options, when you should start screening, and how often you should be screened.  Colorectal Cancer  Routine colorectal cancer screening usually begins at 49 years of age and should be repeated every 5-10 years until you are 49 years old. You may need to be screened more often  if early forms of precancerous polyps or small growths are found. Your health care provider may recommend screening at an earlier age if you have risk factors for colon cancer.  Your health care provider may recommend using home test kits to check for hidden blood in the stool.  A small camera at the end of a tube can be used to examine your colon (sigmoidoscopy or colonoscopy). This checks for the earliest forms of colorectal cancer.  Prostate and Testicular Cancer  Depending on your age and overall health, your health care provider may do certain tests to screen for prostate and testicular cancer.  Talk to your health care provider about any symptoms or concerns you have about testicular or prostate cancer.  Skin Cancer  Check your skin from head to toe regularly.  Tell your health care provider about any new moles or changes in moles, especially if: ? There is a change in a mole's size, shape, or color. ? You have a mole that is larger than a pencil eraser.  Always use sunscreen. Apply sunscreen liberally and repeat throughout the day.  Protect yourself by wearing long sleeves, pants, a wide-brimmed hat, and sunglasses when outside.  What should I know about heart disease, diabetes, and high blood pressure?  If you are 56-59 years of age, have your blood pressure checked every 3-5 years. If you are 36 years of age or older, have your blood pressure checked  every year. You should have your blood pressure measured twice-once when you are at a hospital or clinic, and once when you are not at a hospital or clinic. Record the average of the two measurements. To check your blood pressure when you are not at a hospital or clinic, you can use: ? An automated blood pressure machine at a pharmacy. ? A home blood pressure monitor.  Talk to your health care provider about your target blood pressure.  If you are between 1-30 years old, ask your health care provider if you should take aspirin  to prevent heart disease.  Have regular diabetes screenings by checking your fasting blood sugar level. ? If you are at a normal weight and have a low risk for diabetes, have this test once every three years after the age of 86. ? If you are overweight and have a high risk for diabetes, consider being tested at a younger age or more often.  A one-time screening for abdominal aortic aneurysm (AAA) by ultrasound is recommended for men aged 57-75 years who are current or former smokers. What should I know about preventing infection? Hepatitis B If you have a higher risk for hepatitis B, you should be screened for this virus. Talk with your health care provider to find out if you are at risk for hepatitis B infection. Hepatitis C Blood testing is recommended for:  Everyone born from [birth year] through 1965.  Anyone with known risk factors for hepatitis C.  Sexually Transmitted Diseases (STDs)  You should be screened each year for STDs including gonorrhea and chlamydia if: ? You are sexually active and are younger than 49 years of age. ? You are older than 49 years of age and your health care provider tells you that you are at risk for this type of infection. ? Your sexual activity has changed since you were last screened and you are at an increased risk for chlamydia or gonorrhea. Ask your health care provider if you are at risk.  Talk with your health care provider about whether you are at high risk of being infected with HIV. Your health care provider may recommend a prescription medicine to help prevent HIV infection.  What else can I do?  Schedule regular health, dental, and eye exams.  Stay current with your vaccines (immunizations).  Do not use any tobacco products, such as cigarettes, chewing tobacco, and e-cigarettes. If you need help quitting, ask your health care provider.  Limit alcohol intake to no more than 2 drinks per day. One drink equals 12 ounces of beer, 5 ounces of wine,  or 1 ounces of hard liquor.  Do not use street drugs.  Do not share needles.  Ask your health care provider for help if you need support or information about quitting drugs.  Tell your health care provider if you often feel depressed.  Tell your health care provider if you have ever been abused or do not feel safe at home. This information is not intended to replace advice given to you by your health care provider. Make sure you discuss any questions you have with your health care provider. Document Released: 07/04/2007 Document Revised: 09/04/2015 Document Reviewed: 10/09/2014 Elsevier Interactive Patient Education  [employer].

## 2016-08-12 NOTE — Progress Notes (Signed)
Subjective:  Patient ID: Alexander Bishop, male    DOB: November 12, 1965  Age: 51 y.o. MRN: [id number]  CC: Annual physical  HPI Alexander Bishop is a 51 y.o. male with PMH of Crohn's disease who presents for an annual physical exam.  Preventative Healthcare  Colonoscopy: Last colonoscopy was in 2016. Patient is high risk given Crohn's disease.  Immunizations  Tetanus - reports that he's had tetanus in last 10 years.  Prostate cancer screening: PSA today.  Labs: Screening labs today.  Exercise: Reports he is trying to exercise regularly.  Alcohol use: See below.  Smoking/tobacco use: Nonsmoker. Dips.  PMH, Surgical Hx, Family Hx, Social History reviewed and updated as below.  Past Medical History:  Diagnosis Date  . Allergy   . Crohn disease ([place])   . History of chicken pox   . Rosacea    Past Surgical History:  Procedure Laterality Date  . BOWEL RESECTION  1993  . COLONOSCOPY    . COLONOSCOPY WITH PROPOFOL N/A 12/03/2014   Procedure: COLONOSCOPY WITH PROPOFOL;  Surgeon: Lollie Sails, MD;  Location: [facility] ENDOSCOPY;  Service: Endoscopy;  Laterality: N/A;   Family History  Problem Relation Age of Onset  . Hyperlipidemia Father   . Heart disease Father   . Arthritis Paternal Grandmother   . Arthritis Paternal Grandfather    Social History  Substance Use Topics  . Smoking status: Never Smoker  . Smokeless tobacco: Current User    Types: Snuff     Comment: Grizzly wintergreen Long cut; 1 can/day.   . Alcohol use 0.0 oz/week     Comment: Drinks ~ 8 beers on the weekends.    Review of Systems  Gastrointestinal: Positive for diarrhea.  All other systems reviewed and are negative.  Objective:   Today's Vitals: BP 108/82 (BP Location: Left Arm, Patient Position: Sitting, Cuff Size: Normal)   Pulse 84   Temp 98.4 F (36.9 C) (Oral)   Ht 5' 8"  (1.727 m)   Wt 166 lb 8 oz (75.5 kg)   SpO2 98%   BMI 25.32 kg/m   Physical Exam  Constitutional: He is  oriented to person, place, and time. He appears well-developed and well-nourished. No distress.  HENT:  Head: Normocephalic and atraumatic.  Nose: Nose normal.  Mouth/Throat: Oropharynx is clear and moist. No oropharyngeal exudate.  Normal TM's bilaterally.   Eyes: Conjunctivae are normal. No scleral icterus.  Neck: Neck supple.  Cardiovascular: Normal rate and regular rhythm.   No murmur heard. Pulmonary/Chest: Effort normal and breath sounds normal. He has no wheezes. He has no rales.  Abdominal: Soft. He exhibits no distension. There is no tenderness. There is no rebound and no guarding.  Musculoskeletal: Normal range of motion. He exhibits no edema.  Lymphadenopathy:    He has no cervical adenopathy.  Neurological: He is alert and oriented to person, place, and time.  Skin: Skin is warm and dry. No rash noted.  Psychiatric: He has a normal mood and affect.  Vitals reviewed.  Assessment & Plan:   Problem List Items Addressed This Visit      Digestive   Crohn's disease [facility])   Relevant Orders   Ambulatory referral to Gastroenterology     Other   Annual physical exam - Primary    Screening labs today. Doing well and essentially has no complaints. He does have diarrhea but this is chronic secondary to Crohn's. Advised to cut back and quit smokeless tobacco. Referring to GI as patient  needs to be followed closely as he is high risk for colon cancer given Crohn's. His last colonoscopy was in 2016. Likely needs a colonoscopy in the next year.       Relevant Orders   CBC   Comprehensive metabolic panel   Lipid panel   PSA      Meds ordered this encounter  Medications  . ketoconazole (NIZORAL) 2 % shampoo    Refill:  5  . fluconazole (DIFLUCAN) 150 MG tablet    Refill:  0   Follow-up: Annually  [place]

## 2016-08-12 NOTE — Assessment & Plan Note (Signed)
Screening labs today. Doing well and essentially has no complaints. He does have diarrhea but this is chronic secondary to Crohn's. Advised to cut back and quit smokeless tobacco. Referring to GI as patient needs to be followed closely as he is high risk for colon cancer given Crohn's. His last colonoscopy was in 2016. Likely needs a colonoscopy in the next year.

## 2016-08-14 ENCOUNTER — Telehealth: Payer: Self-pay | Admitting: Family Medicine

## 2016-08-14 NOTE — Telephone Encounter (Signed)
Pt called back returning your call. Thank you!

## 2016-08-14 NOTE — Telephone Encounter (Signed)
Patient advised of below and verbalized understanding.  

## 2016-10-06 ENCOUNTER — Encounter (INDEPENDENT_AMBULATORY_CARE_PROVIDER_SITE_OTHER): Payer: Self-pay

## 2016-10-06 ENCOUNTER — Other Ambulatory Visit: Payer: Self-pay

## 2016-10-06 ENCOUNTER — Ambulatory Visit (INDEPENDENT_AMBULATORY_CARE_PROVIDER_SITE_OTHER): Payer: 59 | Admitting: Gastroenterology

## 2016-10-06 ENCOUNTER — Ambulatory Visit: Payer: 59 | Admitting: Gastroenterology

## 2016-10-06 ENCOUNTER — Encounter: Payer: Self-pay | Admitting: Gastroenterology

## 2016-10-06 VITALS — BP 138/90 | HR 84 | Temp 98.5°F | Ht 68.0 in | Wt 168.2 lb

## 2016-10-06 DIAGNOSIS — K5080 Crohn's disease of both small and large intestine without complications: Secondary | ICD-10-CM

## 2016-10-06 NOTE — Progress Notes (Signed)
Cephas Darby, MD [address]  [place]  [place], [place] [address]  Main: [phone]  Fax: [phone]    Gastroenterology Consultation  Referring Provider:     Coral Spikes, DO Primary Care Physician:  Coral Spikes, DO Primary Gastroenterologist:  Dr. Cephas Darby Reason for Consultation:     Crohn's disease        HPI:   Alexander Bishop is a 51 y.o. y/o male referred by Dr. Coral Spikes, DO  for consultation & management of Crohn's disease He has history of ileocolonic Crohn's disease diagnosed in 1993, detailed history as described below, status post ileocecectomy, has been in clinical admission since surgery, not on any medications at present. Currently, he reports doing very well. He has 2 loose, nonbloody bowel movements daily and sometimes more depending on what he eats especially blueberry muffin. He takes Imodium as needed. Sometimes, he finds it hard during travel but he avoids eating before travels so that he can avoid using the bathroom. Sometimes, he finds the closest bathroom. Denies abdominal pain, weight loss, loss of appetite, nausea, vomiting, fever, chills. Denies any perianal lesions, rectal discomfort, urgency, rectal bleeding. He is currently not on B12 injections on oral iron.  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 Has been doing well since surgery. Continues to have two nonbloody loose bowel movements  daily. Tried Colestid In the past with no benefit.  He was on B12 injections for B12 deficiency. Extra intestinal manifestations: None  IBD surgical history: Ileocecectomy in 1993  Imaging:  MRE none CTE none SBFT 12/16/1998  Impression: 1. Evidence of duodenitis, without evidence of ulcer. 2. 2 nonspecific areas of smooth narrowing within the neoterminal ileum, causing no significant obstruction at this time.  Procedures: Colonoscopy 10/04/1991 with Dr. GTitus Mould COLONOSCOPY TO ILEUM. INFLAMMED, ERYTHEMATOUS ILEOCECAL  VALVE-BIOPSIED. ILEUM ALSO APPEARED INFLAMMED. MULTIPLE INFLAMMATORY -  APPEARING POLYPS NOTED IN CECUM. APPENDIX NOT VISUALIZED, BUT PROBABLE  APPENDICEAL AREA MARKED WITH INFLAMMATION AND PUS - BIOPSIES TAKEN. A. "BIOPSY ILEUM, CECAL VALVE":     CHANGES CONSISTENT WITH QUIESCENT CROHN'S DISEASE.  NO EVIDENCE OF    MALIGNANCY. ------------------------------------------------------------------------------------------------------------------ Surgical specimen ileocecectomy 11/10/1991. Small bowel 60 cm in length, cecum 6 cm in length Pathology DIAGNOSIS: A.  "SMALL BOWEL AND CECUM":  1.  CECUM FROM AREA OF ILEOCECAL VALVE WITH GRANULATION TISSUE FORMATION, ULCERATION, FISSURE FORMATION WITH ABSCESS FORMATION ACCOMPANIED BY FIBROSIS AND TRANSMURAL CHRONIC INFLAMMATION.  2.  ILEUM WITH SEVERE ACUTE AND CHRONIC INFLAMMATION, TRANSMURAL FIBROSIS AND TRANSMURAL CHRONIC INFLAMMATION ACCOMPANIED BY ADHESIONS TO ADJACENT SMALL INTESTINE AND GRANULATION TISSUE FORMATION ON THE SEROSA.  3.  APPENDIX WITH ULCERATION, CRYPT ABSCESS FORMATION, SEVERE CHRONIC INFLAMMATION, FIBROSIS AND FOCAL GRANULOMATOUS INFLAMMATION WITH MULTINUCLEATE GIANT CELLS PRESENT.(ACID FAST AND FUNGAL STAIN ARE BOTH NEGATIVE.)  4.  MARGINS APPEAR RELATIVELY UNREMARKABLE.  5.  ELEVEN MESENTERIC REGIONAL LYMPH NODES NEGATIVE FOR MALIGNANCY WITH ONE LYMPH NODE CONTAINING GRANULOMATOUS  INFLAMMATION AND HISTIOCYTIC GIANT CELLS. IN ONE OF THE GRANULOMAS FOREIGN MATERIAL IS NOTED WITHIN THE LYMPH NODE  PARENCHYMA. ACID FAST STAIN AND FUNGAL STAIN ARE BOTH NEGATIVE.  --------------------------------------------------------------------------------------------------------------------------------------- Colonoscopy By Dr.Skulskie on 12/03/2014 Healthy-appearing mucosa at the ileocolonic anastomosis, unable to examine neoterminal ileum due to angulation Granular appearing mucosa in entire colon, random biopsies performed. Normal rectum, unable to do retroflexion, several skin tags representing prior fistula. No obvious fistula seen in the perianal area Pathology DIAGNOSIS:  A. RANDOM COLON, RIGHT; COLD BIOPSY:  - COLONIC MUCOSA NEGATIVE FOR MICROSCOPIC COLITIS, DYSPLASIA AND  MALIGNANCY.  B. RANDOM COLON, LEFT; COLD BIOPSY:  - LYMPHOID NODULES.  - NEGATIVE FOR MICROSCOPIC COLITIS, DYSPLASIA AND MALIGNANCY.    Upper Endoscopy none  VCE none  IBD medications:  Steroids:Prednisone yes 5-ASA: Sulfasalazine, Asacol Immunomodulators: AZA, methotrexate: Nave TPMT status unknown Biologics: Nave Anti TNFs: Anti Integrins: Ustekinumab: Tofactinib: Clinical trial:  Past Medical History:  Diagnosis Date  . Allergy   . Crohn disease ([place])   . History of chicken pox   . Rosacea     Past Surgical History:  Procedure Laterality Date  . BOWEL RESECTION  1993  . COLONOSCOPY    . COLONOSCOPY WITH PROPOFOL N/A 12/03/2014   Procedure: COLONOSCOPY WITH PROPOFOL;  Surgeon: Lollie Sails, MD;  Location: [facility] ENDOSCOPY;  Service: Endoscopy;  Laterality: N/A;    Prior to Admission medications   Medication Sig Start Date End Date Taking? Authorizing Provider  fluconazole (DIFLUCAN) 150 MG tablet  08/06/16   [provider]  ketoconazole (NIZORAL) 2 % shampoo  08/06/16   [provider]  sildenafil (VIAGRA) 50 MG tablet Take 1 tablet (50 mg total) by mouth  daily as needed for erectile dysfunction. 02/24/16   Coral Spikes, DO  tadalafil (CIALIS) 5 MG tablet Take by mouth. 08/08/14   [provider]    Family History  Problem Relation Age of Onset  . Hyperlipidemia Father   . Heart disease Father   . Arthritis Paternal Grandmother   . Arthritis Paternal Grandfather      Social History  Substance Use Topics  . Smoking status: Never Smoker  . Smokeless tobacco: Current User    Types: Snuff     Comment: Grizzly wintergreen Long cut; 1 can/day.   . Alcohol use 0.0 oz/week     Comment: Drinks ~ 8 beers on the weekends.     Allergies as of 10/06/2016  . (No Known Allergies)    Review of Systems:    All systems reviewed and negative except where noted in HPI.   Physical Exam:  BP 138/90   Pulse 84   Temp 98.5 F (36.9 C) (Oral)   Ht 5' 8"  (1.727 m)   Wt 168 lb 3.2 oz (76.3 kg)   BMI 25.57 kg/m  No LMP for male patient.  General:   Alert,  Well-developed, well-nourished, pleasant and cooperative in NAD Head:  Normocephalic and atraumatic. Eyes:  Sclera clear, no icterus.   Conjunctiva pink. Ears:  Normal auditory acuity. Nose:  No deformity, discharge, or lesions. Mouth:  No deformity or lesions,oropharynx pink & moist. Neck:  Supple; no masses or thyromegaly. Lungs:  Respirations even and unlabored.  Clear throughout to auscultation.   No wheezes, crackles, or rhonchi. No acute distress. Heart:  Regular rate and rhythm; no murmurs, clicks, rubs, or gallops. Abdomen:  Normal bowel sounds.  No bruits.  Soft, non-tender and non-distended without masses, hepatosplenomegaly or hernias noted.  No guarding or rebound tenderness.  Midline, vertical scar, nontender Rectal: Nor performed Msk:  Symmetrical without gross deformities. Good, equal  movement & strength bilaterally. Pulses:  Normal pulses noted. Extremities:  No clubbing or edema.  No cyanosis. Neurologic:  Alert and oriented x3;  grossly normal  neurologically. Skin:  Intact without significant lesions or rashes. No jaundice. Lymph Nodes:  No significant cervical adenopathy. Psych:  Alert and cooperative. Normal mood and affect.   Assessment and Plan:   HILTON SAEPHAN is a 51 y.o. y/oCaucasian male with ileocolonic Crohn's disease, not stricturing not penetrating, diagnosed in 1993, status post ileocecostomy in 1993, in clinical remission, off therapy since surgery. Surprisingly, he did not have clinical recurrence of Crohn's disease since surgery. He had a small bowel follow-through in year 2000 which revealed 2 areas of smooth narrowing in the neoterminal ileum. His recent colonoscopy in 2016 revealed normal colon, unable to examine neoterminal ileum but the ileocolonic anastomosis appeared healthy. Based on the findings of small bowel follow-through, I recommended repeat colonoscopy to examine for any postop recurrence of Crohn's disease. However, patient refused to undergo colonoscopy as he feels well. We discussed about alternative tests such as MR enterography or a video capsule endoscopy. He agreed for either one of those and I prefer MR enterography to VCE. I also recommend checking fecal Calprotectin, CRP, ferritin, vitamin B12, vitamin D. If MR enterography is completely normal, does not reveal any evidence of narrowing, I will proceed with video capsule endoscopy to evaluate for any surface inflammation.  IBD Health Maintenance  1.TB status: Unknown 2. Anemia: Absent 3.Immunizations: Hep A and B status unknown, Influenza 02/13/2016, prevnar not received, pneumovax not received, Varicella unknown 4.Cancer screening I) Colon cancer/dysplasia surveillance: Up to date, last colonoscopy in 2016, normal 5.Bone health Vitamin D status: Check vitamin D levels Bone density testing: Not done 5. Labs: Check B12, ferritin, vitamin D levels 6. Smoking: Consult to quit chewing tobacco. Discussed about the risk of progression of Crohn's  disease associated with tobacco 7. NSAIDs and Antibiotics use: None  Follow up in 3-4 months or sooner based on the above workup   Cephas Darby, MD

## 2016-10-07 ENCOUNTER — Other Ambulatory Visit
Admission: RE | Admit: 2016-10-07 | Discharge: 2016-10-07 | Disposition: A | Discharge status: Home / Self Care | Payer: 59 | Source: Ambulatory Visit | Attending: Gastroenterology | Admitting: Gastroenterology

## 2016-10-07 DIAGNOSIS — K5080 Crohn's disease of both small and large intestine without complications: Secondary | ICD-10-CM | POA: Insufficient documentation

## 2016-10-07 LAB — FERRITIN: FERRITIN: 95 ng/mL (ref 24–336)

## 2016-10-07 LAB — C-REACTIVE PROTEIN

## 2016-10-07 LAB — VITAMIN B12: Vitamin B-12: 156 pg/mL — ABNORMAL LOW (ref 180–914)

## 2016-10-08 ENCOUNTER — Other Ambulatory Visit: Payer: Self-pay

## 2016-10-08 ENCOUNTER — Telehealth: Payer: Self-pay

## 2016-10-08 DIAGNOSIS — E538 Deficiency of other specified B group vitamins: Secondary | ICD-10-CM

## 2016-10-08 LAB — VITAMIN D 25 HYDROXY (VIT D DEFICIENCY, FRACTURES): Vit D, 25-Hydroxy: 36.4 ng/mL (ref 30.0–100.0)

## 2016-10-08 MED ORDER — CYANOCOBALAMIN 1000 MCG/ML IJ SOLN: INTRAMUSCULAR | 1 refills | Status: DC

## 2016-10-08 NOTE — Telephone Encounter (Signed)
Patient has been informed  His labs came back normal except vitamin B12 levels and you recommend weekly B12 injections for 4weeks, then biweekly, then monthly. He said he has administered to himself.  Rx has been sent to pharmacy. Thanks [employer]

## 2017-01-29 DIAGNOSIS — H18231 Secondary corneal edema, right eye: Secondary | ICD-10-CM | POA: Diagnosis not present

## 2017-04-23 ENCOUNTER — Ambulatory Visit (INDEPENDENT_AMBULATORY_CARE_PROVIDER_SITE_OTHER): Payer: 59 | Admitting: Family

## 2017-04-23 ENCOUNTER — Encounter: Payer: Self-pay | Admitting: Family

## 2017-04-23 VITALS — BP 118/90 | HR 80 | Temp 98.1°F | Resp 16 | Wt 175.1 lb

## 2017-04-23 DIAGNOSIS — Z23 Encounter for immunization: Secondary | ICD-10-CM | POA: Diagnosis not present

## 2017-04-23 DIAGNOSIS — N521 Erectile dysfunction due to diseases classified elsewhere: Secondary | ICD-10-CM | POA: Diagnosis not present

## 2017-04-23 DIAGNOSIS — E538 Deficiency of other specified B group vitamins: Secondary | ICD-10-CM | POA: Diagnosis not present

## 2017-04-23 DIAGNOSIS — K5090 Crohn's disease, unspecified, without complications: Secondary | ICD-10-CM | POA: Diagnosis not present

## 2017-04-23 MED ORDER — TADALAFIL 20 MG PO TABS: 10.0000 mg | ORAL_TABLET | ORAL | 5 refills | Status: DC | PRN

## 2017-04-23 NOTE — Assessment & Plan Note (Addendum)
Pleased to see that symptomatically patient is doing well.  chronic diarrhea, denies any blood.  Overall he is quite pleased.  He has not had follow-up with Dr. Marius Ditch, or MRI as she suggested.  We had a long discussion regarding this today, and advised him that I was uncomfortable with providing surveillance for his Crohn's, and that he will need to continue to follow-up with GI.  He stated he was happy to do so and will make follow-up appointment to see Dr. Marius Ditch. Of note, will return for CPE, and labs.

## 2017-04-23 NOTE — Progress Notes (Signed)
Subjective:    Patient ID: Alexander Bishop, male    DOB: 1965-07-27, 52 y.o.   MRN: [id number]  CC: Alexander Bishop is a 52 y.o. male who presents today for follow up.   HPI: Feels well today. No complaints.   Would like to discuss generic cilalis as wanders if can get  more pills; he also also needs vitamin b12 needles.  He has been splitting Viagra into forths with a pill cutter.  He says this low-dose works well.  However he only received 5 tablets when in the past PCP had prescribed 10 tablets with refills  Crohns- chronic diarrhea. Bowel ressection 1994. No blood in stool.  Dr Alexander Bishop prescribed b12 09/2016 however didnt start medication.   Had been on the past on b12, Feels a little bit out of shape and also tired. Starting a diet next week.       GI Dr Alexander Bishop 09/2016 history of Alexander Bishop disease diagnosed 76.  Colonoscopy 2016. HISTORY:  Past Medical History:  Diagnosis Date  . Allergy   . Crohn disease ([place])   . History of chicken pox   . Rosacea    Past Surgical History:  Procedure Laterality Date  . BOWEL RESECTION  1993  . COLONOSCOPY    . COLONOSCOPY WITH PROPOFOL N/A 12/03/2014   Procedure: COLONOSCOPY WITH PROPOFOL;  Surgeon: Lollie Sails, MD;  Location: [facility] ENDOSCOPY;  Service: Endoscopy;  Laterality: N/A;   Family History  Problem Relation Age of Onset  . Hyperlipidemia Father   . Heart disease Father   . Arthritis Paternal Grandmother   . Arthritis Paternal Grandfather     Allergies: Patient has no known allergies. Current Outpatient Medications on File Prior to Visit  Medication Sig Dispense Refill  . ketoconazole (NIZORAL) 2 % shampoo   5  . cyanocobalamin (,VITAMIN B-12,) 1000 MCG/ML injection Administer 1 mL weekly for 4 weeks, 3m every other week, then once monthly (Patient not taking: Reported on 04/23/2017) 10 mL 1  . fluconazole (DIFLUCAN) 150 MG tablet   0   No current facility-administered medications on file prior to visit.      Social History   Tobacco Use  . Smoking status: Never Smoker  . Smokeless tobacco: Current User    Types: Snuff  . Tobacco comment: Grizzly wintergreen Long cut; 1 can/day.   Substance Use Topics  . Alcohol use: Yes    Alcohol/week: 0.0 oz    Comment: Drinks ~ 8 beers on the weekends.   . Drug use: No    Review of Systems  Constitutional: Positive for fatigue. Negative for chills and fever.  Respiratory: Negative for cough and shortness of breath.   Cardiovascular: Negative for chest pain and palpitations.  Gastrointestinal: Positive for diarrhea (chronic). Negative for abdominal pain, anal bleeding, blood in stool, nausea and vomiting.      Objective:    BP 118/90 (BP Location: Left Arm, Patient Position: Sitting, Cuff Size: Normal)   Pulse 80   Temp 98.1 F (36.7 C) (Oral)   Resp 16   Wt 175 lb 2 oz (79.4 kg)   SpO2 98%   BMI 26.63 kg/m  BP Readings from Last 3 Encounters:  04/23/17 118/90  10/06/16 138/90  08/12/16 108/82   Wt Readings from Last 3 Encounters:  04/23/17 175 lb 2 oz (79.4 kg)  10/06/16 168 lb 3.2 oz (76.3 kg)  08/12/16 166 lb 8 oz (75.5 kg)    Physical Exam  Constitutional: He appears  well-developed and well-nourished.  Cardiovascular: Regular rhythm and normal heart sounds.  Pulmonary/Chest: Effort normal and breath sounds normal. No respiratory distress. He has no wheezes. He has no rhonchi. He has no rales.  Neurological: He is alert.  Skin: Skin is warm and dry.  Psychiatric: He has a normal mood and affect. His speech is normal and behavior is normal.  Vitals reviewed.      Assessment & Plan:   Problem List Items Addressed This Visit      Digestive   Alexander Bishop disease without complication ([place]) - Primary    Pleased to see that symptomatically patient is doing well.  chronic diarrhea, denies any blood.  Overall he is quite pleased.  He has not had follow-up with Dr. Marius Alexander Bishop, or MRI as she suggested.  We had a long discussion  regarding this today, and advised him that I was uncomfortable with providing surveillance for his Alexander Bishop, and that he will need to continue to follow-up with GI.  He stated he was happy to do so and will make follow-up appointment to see Dr. Marius Alexander Bishop. Of note, will return for CPE, and labs.       Relevant Orders   CBC with Differential/Platelet   Comprehensive metabolic panel   Hemoglobin A1c   Lipid panel   TSH   VITAMIN D 25 Hydroxy (Vit-D Deficiency, Fractures)   B12 and Folate Panel     Genitourinary   Male erectile dysfunction    Patient continues to need Viagra from time to time.  He wanted to see if he was able to get more tablets per insurance with Cialis.  I thought this change appropriate.  I prescribed low-dose Cialis for patient to try.  I advised him it is longer lasting and to let me know of any side effects.  Patient will also let me know which he was prefers to stay with -Cialis versus Viagra      Relevant Medications   tadalafil (CIALIS) 20 MG tablet     Other   B12 deficiency    Has not been taking B12 as prescribed by Dr. Marius Alexander Bishop in 2018.  We have given him a prescription for lancets today, he will start this medication and we will recheck a B12 over the summer when he returns for physical.       Other Visit Diagnoses    Need for diphtheria-tetanus-pertussis (Tdap) vaccine       Relevant Orders   Tdap vaccine greater than or equal to 7yo IM (Completed)       I have discontinued Danelle Berry. Emberson's sildenafil. I am also having him start on tadalafil. Additionally, I am having him maintain his ketoconazole, cyanocobalamin, and fluconazole.   Meds ordered this encounter  Medications  . tadalafil (CIALIS) 20 MG tablet    Sig: Take 0.5 tablets (10 mg total) by mouth every other day as needed for erectile dysfunction. Max: 20m/dose up to 1 dose/24h. Effects may last 36 hours.    Dispense:  10 tablet    Refill:  5    Order Specific Question:   Supervising  Provider    Answer:   TCrecencio Mc[[id number]]    Return precautions given.   Risks, benefits, and alternatives of the medications and treatment plan prescribed today were discussed, and patient expressed understanding.   Education regarding symptom management and diagnosis given to patient on AVS.  Continue to follow with ABurnard Hawthorne FNP for routine health maintenance.   MRoselind Messier  and I agreed with plan.   Mable Paris, FNP

## 2017-04-23 NOTE — Assessment & Plan Note (Signed)
Has not been taking B12 as prescribed by Dr. Marius Ditch in 2018.  We have given him a prescription for lancets today, he will start this medication and we will recheck a B12 over the summer when he returns for physical.

## 2017-04-23 NOTE — Patient Instructions (Signed)
May trial Cialis. Please know this medication lasts longer.   Prescribed 85m which you can take every OTHER day as needed.   Max dose: 214mdose up to 1 dose/24h. Effects may last 36 hours.   Let me know if you want to stay on cialis OR viagra  Please make follow up with Dr VaMarius Ditchor September as discussed

## 2017-04-23 NOTE — Assessment & Plan Note (Signed)
Patient continues to need Viagra from time to time.  He wanted to see if he was able to get more tablets per insurance with Cialis.  I thought this change appropriate.  I prescribed low-dose Cialis for patient to try.  I advised him it is longer lasting and to let me know of any side effects.  Patient will also let me know which he was prefers to stay with -Cialis versus Viagra

## 2017-07-28 ENCOUNTER — Ambulatory Visit (INDEPENDENT_AMBULATORY_CARE_PROVIDER_SITE_OTHER): Payer: 59 | Admitting: Family

## 2017-07-28 ENCOUNTER — Encounter: Payer: Self-pay | Admitting: Family

## 2017-07-28 VITALS — BP 110/90 | HR 90 | Temp 98.3°F | Ht 69.5 in | Wt 157.0 lb

## 2017-07-28 DIAGNOSIS — Z0000 Encounter for general adult medical examination without abnormal findings: Secondary | ICD-10-CM

## 2017-07-28 DIAGNOSIS — E538 Deficiency of other specified B group vitamins: Secondary | ICD-10-CM

## 2017-07-28 DIAGNOSIS — K5090 Crohn's disease, unspecified, without complications: Secondary | ICD-10-CM

## 2017-07-28 MED ORDER — "SYRINGE 25G X 1"" 3 ML MISC": 3 refills | Status: DC

## 2017-07-28 NOTE — Assessment & Plan Note (Signed)
Stable. Pending re-establishing with Dr Marius Ditch

## 2017-07-28 NOTE — Progress Notes (Signed)
Subjective:    Patient ID: Alexander Bishop, male    DOB: 03/23/1965, 52 y.o.   MRN: [id number]  CC: Alexander Bishop is a 52 y.o. male who presents today for physical exam.    HPI: Doing well today No complaints. No fatigue, diarrhea, constipation, blood in stool.   Vitamin B12 deficiency never started as didn't get syringes  Crohns disease- Feels well at this time. No symptoms. Hasnt seen Dr Marius Ditch in some time.    Colorectal  Cancer Screening: Dr Gustavo Lah 2016; unable to see micro report.  Prostate Cancer Screening: Agrees to screen. No hesistancy, decreased urine.  Lung Cancer Screening: No 30 year pack year history and > 55 years.  Immunizations       Tetanus - utd        Labs: Screening labs today. Exercise: Gets regular exercise.  Alcohol use:  On weekends Smoking/tobacco use: Snuff. No quit date in mind. Doesn't like chantix. No h/o seizures.   Regular dental exams: UTD Wears seat belt: Yes. Skin: follows with dermatology every 2 years.   HISTORY:  Past Medical History:  Diagnosis Date  . Allergy   . Crohn disease ([place])   . History of chicken pox   . Rosacea     Past Surgical History:  Procedure Laterality Date  . BOWEL RESECTION  1993  . COLONOSCOPY    . COLONOSCOPY WITH PROPOFOL N/A 12/03/2014   Procedure: COLONOSCOPY WITH PROPOFOL;  Surgeon: Lollie Sails, MD;  Location: [facility] ENDOSCOPY;  Service: Endoscopy;  Laterality: N/A;   Family History  Problem Relation Age of Onset  . Hyperlipidemia Father   . Heart disease Father   . Arthritis Paternal Grandmother   . Arthritis Paternal Grandfather       ALLERGIES: Patient has no known allergies.  Current Outpatient Medications on File Prior to Visit  Medication Sig Dispense Refill  . cyanocobalamin (,VITAMIN B-12,) 1000 MCG/ML injection Administer 1 mL weekly for 4 weeks, 60m every other week, then once monthly (Patient not taking: Reported on 04/23/2017) 10 mL 1  . ketoconazole (NIZORAL) 2 %  shampoo   5  . tadalafil (CIALIS) 20 MG tablet Take 0.5 tablets (10 mg total) by mouth every other day as needed for erectile dysfunction. Max: 226mdose up to 1 dose/24h. Effects may last 36 hours. 10 tablet 5   No current facility-administered medications on file prior to visit.     Social History   Tobacco Use  . Smoking status: Never Smoker  . Smokeless tobacco: Current User    Types: Snuff  . Tobacco comment: Grizzly wintergreen Long cut; 1 can/day.   Substance Use Topics  . Alcohol use: Yes    Alcohol/week: 0.0 oz    Comment: Drinks ~ 8 beers on the weekends.   . Drug use: No    Review of Systems  Constitutional: Negative for chills and fever.  HENT: Negative for congestion.   Respiratory: Negative for cough.   Cardiovascular: Negative for chest pain, palpitations and leg swelling.  Gastrointestinal: Negative for abdominal pain, blood in stool, constipation, diarrhea, nausea and vomiting.  Genitourinary: Negative for difficulty urinating, dysuria and hematuria.  Musculoskeletal: Negative for myalgias.  Skin: Negative for rash.  Neurological: Negative for headaches.  Hematological: Negative for adenopathy.  Psychiatric/Behavioral: Negative for confusion.      Objective:    BP 110/90 (BP Location: Left Arm, Patient Position: Sitting, Cuff Size: Normal)   Pulse 90   Temp 98.3 F (36.8 C) (Oral)  Ht 5' 9.5" (1.765 m)   Wt 157 lb (71.2 kg)   SpO2 98%   BMI 22.85 kg/m   BP Readings from Last 3 Encounters:  07/28/17 110/90  04/23/17 118/90  10/06/16 138/90   Wt Readings from Last 3 Encounters:  07/28/17 157 lb (71.2 kg)  04/23/17 175 lb 2 oz (79.4 kg)  10/06/16 168 lb 3.2 oz (76.3 kg)    Physical Exam  Constitutional: He appears well-developed and well-nourished.  Neck: No thyroid mass and no thyromegaly present.  Cardiovascular: Regular rhythm and normal heart sounds.  Pulmonary/Chest: Effort normal and breath sounds normal. No respiratory distress. He  has no wheezes. He has no rhonchi. He has no rales.  Lymphadenopathy:       Head (right side): No submental, no submandibular, no tonsillar, no preauricular, no posterior auricular and no occipital adenopathy present.       Head (left side): No submental, no submandibular, no tonsillar, no preauricular, no posterior auricular and no occipital adenopathy present.    He has no cervical adenopathy.    He has no axillary adenopathy.  Neurological: He is alert.  Skin: Skin is warm and dry.  Psychiatric: He has a normal mood and affect. His speech is normal and behavior is normal.  Vitals reviewed.      Assessment & Plan:   Problem List Items Addressed This Visit      Digestive   Crohn's disease without complication ([place])    Stable. Pending re-establishing with Dr Marius Ditch        Other   Annual physical exam - Primary    Declines prostate exam in the absence of symptoms.  He consents to annual monitoring of PSA.  Discussed cessation of snuff, patient will consider this.  We discussed Wellbutrin as a potential option.  Patient is likely due for colonoscopy however unable to determine this is not able to see biopsy results.  At this point, patient and I jointly agreed he will follow-up with Dr. Marius Ditch as he is due for his Crohn's follow-up and can discuss with her when he is due for colonoscopy.  Referral placed. Note: We discussed relevant and past history of abnormal labs today at length, we decided to order appropriate labs for screening today based on this discussion. Patient was comfortable with this. Patient was advised if any symptoms were to change after today's visit, to notify me as we may always order additional labs.       Relevant Orders   Comprehensive metabolic panel   Lipid panel   B12 and Folate Panel   PSA   Ambulatory referral to Gastroenterology       I have discontinued Danelle Berry. Dorning's fluconazole. I am also having him maintain his ketoconazole, cyanocobalamin,  and tadalafil.   No orders of the defined types were placed in this encounter.   Return precautions given.   Risks, benefits, and alternatives of the medications and treatment plan prescribed today were discussed, and patient expressed understanding.   Education regarding symptom management and diagnosis given to patient on AVS.   Continue to follow with Burnard Hawthorne, FNP for routine health maintenance.   [name] and I agreed with plan.   Mable Paris, FNP

## 2017-07-28 NOTE — Assessment & Plan Note (Signed)
Declines prostate exam in the absence of symptoms.  He consents to annual monitoring of PSA.  Discussed cessation of snuff, patient will consider this.  We discussed Wellbutrin as a potential option.  Patient is likely due for colonoscopy however unable to determine this is not able to see biopsy results.  At this point, patient and I jointly agreed he will follow-up with Dr. Marius Ditch as he is due for his Crohn's follow-up and can discuss with her when he is due for colonoscopy.  Referral placed. Note: We discussed relevant and past history of abnormal labs today at length, we decided to order appropriate labs for screening today based on this discussion. Patient was comfortable with this. Patient was advised if any symptoms were to change after today's visit, to notify me as we may always order additional labs.

## 2017-07-28 NOTE — Patient Instructions (Addendum)
Fasting labs when you can- please make an appointment AFTER you have started b12.   Let me know about Wellbutrin  Today we discussed referrals, orders. DR VANGA/GI   I have placed these orders in the system for you.  Please be sure to give [place] a call if you have not heard from our office regarding scheduling a test or regarding referral in a timely manner.  It is very important that you let me know as soon as possible.     Health Maintenance, Male A healthy lifestyle and preventive care is important for your health and wellness. Ask your health care provider about what schedule of regular examinations is right for you. What should I know about weight and diet? Eat a Healthy Diet  Eat plenty of vegetables, fruits, whole grains, low-fat dairy products, and lean protein.  Do not eat a lot of foods high in solid fats, added sugars, or salt.  Maintain a Healthy Weight Regular exercise can help you achieve or maintain a healthy weight. You should:  Do at least 150 minutes of exercise each week. The exercise should increase your heart rate and make you sweat (moderate-intensity exercise).  Do strength-training exercises at least twice a week.  Watch Your Levels of Cholesterol and Blood Lipids  Have your blood tested for lipids and cholesterol every 5 years starting at 50 years of age. If you are at high risk for heart disease, you should start having your blood tested when you are 50 years old. You may need to have your cholesterol levels checked more often if: ? Your lipid or cholesterol levels are high. ? You are older than 50 years of age. ? You are at high risk for heart disease.  What should I know about cancer screening? Many types of cancers can be detected early and may often be prevented. Lung Cancer  You should be screened every year for lung cancer if: ? You are a current smoker who has smoked for at least 30 years. ? You are a former smoker who has quit within the past 15  years.  Talk to your health care provider about your screening options, when you should start screening, and how often you should be screened.  Colorectal Cancer  Routine colorectal cancer screening usually begins at 50 years of age and should be repeated every 5-10 years until you are 50 years old. You may need to be screened more often if early forms of precancerous polyps or small growths are found. Your health care provider may recommend screening at an earlier age if you have risk factors for colon cancer.  Your health care provider may recommend using home test kits to check for hidden blood in the stool.  A small camera at the end of a tube can be used to examine your colon (sigmoidoscopy or colonoscopy). This checks for the earliest forms of colorectal cancer.  Prostate and Testicular Cancer  Depending on your age and overall health, your health care provider may do certain tests to screen for prostate and testicular cancer.  Talk to your health care provider about any symptoms or concerns you have about testicular or prostate cancer.  Skin Cancer  Check your skin from head to toe regularly.  Tell your health care provider about any new moles or changes in moles, especially if: ? There is a change in a mole's size, shape, or color. ? You have a mole that is larger than a pencil eraser.  Always use sunscreen.  Apply sunscreen liberally and repeat throughout the day.  Protect yourself by wearing long sleeves, pants, a wide-brimmed hat, and sunglasses when outside.  What should I know about heart disease, diabetes, and high blood pressure?  If you are 99-53 years of age, have your blood pressure checked every 3-5 years. If you are 65 years of age or older, have your blood pressure checked every year. You should have your blood pressure measured twice-once when you are at a hospital or clinic, and once when you are not at a hospital or clinic. Record the average of the two  measurements. To check your blood pressure when you are not at a hospital or clinic, you can use: ? An automated blood pressure machine at a pharmacy. ? A home blood pressure monitor.  Talk to your health care provider about your target blood pressure.  If you are between 79-20 years old, ask your health care provider if you should take aspirin to prevent heart disease.  Have regular diabetes screenings by checking your fasting blood sugar level. ? If you are at a normal weight and have a low risk for diabetes, have this test once every three years after the age of 11. ? If you are overweight and have a high risk for diabetes, consider being tested at a younger age or more often.  A one-time screening for abdominal aortic aneurysm (AAA) by ultrasound is recommended for men aged 80-75 years who are current or former smokers. What should I know about preventing infection? Hepatitis B If you have a higher risk for hepatitis B, you should be screened for this virus. Talk with your health care provider to find out if you are at risk for hepatitis B infection. Hepatitis C Blood testing is recommended for:  Everyone born from [birth year] through 1963.  Anyone with known risk factors for hepatitis C.  Sexually Transmitted Diseases (STDs)  You should be screened each year for STDs including gonorrhea and chlamydia if: ? You are sexually active and are younger than 50 years of age. ? You are older than 50 years of age and your health care provider tells you that you are at risk for this type of infection. ? Your sexual activity has changed since you were last screened and you are at an increased risk for chlamydia or gonorrhea. Ask your health care provider if you are at risk.  Talk with your health care provider about whether you are at high risk of being infected with HIV. Your health care provider may recommend a prescription medicine to help prevent HIV infection.  What else can I do?  Schedule  regular health, dental, and eye exams.  Stay current with your vaccines (immunizations).  Do not use any tobacco products, such as cigarettes, chewing tobacco, and e-cigarettes. If you need help quitting, ask your health care provider.  Limit alcohol intake to no more than 2 drinks per day. One drink equals 12 ounces of beer, 5 ounces of wine, or 1 ounces of hard liquor.  Do not use street drugs.  Do not share needles.  Ask your health care provider for help if you need support or information about quitting drugs.  Tell your health care provider if you often feel depressed.  Tell your health care provider if you have ever been abused or do not feel safe at home. This information is not intended to replace advice given to you by your health care provider. Make sure you discuss any questions you have  with your health care provider. Document Released: 07/04/2007 Document Revised: 09/04/2015 Document Reviewed: 10/09/2014 Elsevier Interactive Patient Education  [employer].

## 2017-08-03 ENCOUNTER — Other Ambulatory Visit: Payer: Self-pay

## 2017-08-03 DIAGNOSIS — Z1211 Encounter for screening for malignant neoplasm of colon: Secondary | ICD-10-CM

## 2017-08-10 ENCOUNTER — Other Ambulatory Visit: Payer: Self-pay

## 2017-08-16 ENCOUNTER — Encounter: Payer: 59 | Admitting: Family Medicine

## 2017-08-30 ENCOUNTER — Encounter
Admission: RE | Disposition: A | Discharge status: Home / Self Care | Payer: Self-pay | Source: Ambulatory Visit | Attending: Gastroenterology

## 2017-08-30 ENCOUNTER — Ambulatory Visit: Payer: 59 | Admitting: Anesthesiology

## 2017-08-30 ENCOUNTER — Encounter: Payer: Self-pay | Admitting: *Deleted

## 2017-08-30 ENCOUNTER — Ambulatory Visit
Admission: RE | Admit: 2017-08-30 | Discharge: 2017-08-30 | Disposition: A | Discharge status: Home / Self Care | Payer: 59 | Source: Ambulatory Visit | Attending: Gastroenterology | Admitting: Gastroenterology

## 2017-08-30 DIAGNOSIS — K9189 Other postprocedural complications and disorders of digestive system: Secondary | ICD-10-CM

## 2017-08-30 DIAGNOSIS — K6389 Other specified diseases of intestine: Secondary | ICD-10-CM | POA: Insufficient documentation

## 2017-08-30 DIAGNOSIS — K50012 Crohn's disease of small intestine with intestinal obstruction: Secondary | ICD-10-CM | POA: Diagnosis not present

## 2017-08-30 DIAGNOSIS — K56609 Unspecified intestinal obstruction, unspecified as to partial versus complete obstruction: Secondary | ICD-10-CM | POA: Diagnosis not present

## 2017-08-30 DIAGNOSIS — Z79899 Other long term (current) drug therapy: Secondary | ICD-10-CM | POA: Insufficient documentation

## 2017-08-30 DIAGNOSIS — Z8719 Personal history of other diseases of the digestive system: Secondary | ICD-10-CM | POA: Diagnosis not present

## 2017-08-30 DIAGNOSIS — K5090 Crohn's disease, unspecified, without complications: Secondary | ICD-10-CM | POA: Insufficient documentation

## 2017-08-30 DIAGNOSIS — F17220 Nicotine dependence, chewing tobacco, uncomplicated: Secondary | ICD-10-CM | POA: Diagnosis not present

## 2017-08-30 DIAGNOSIS — K644 Residual hemorrhoidal skin tags: Secondary | ICD-10-CM | POA: Insufficient documentation

## 2017-08-30 DIAGNOSIS — Z1211 Encounter for screening for malignant neoplasm of colon: Secondary | ICD-10-CM | POA: Diagnosis not present

## 2017-08-30 DIAGNOSIS — K5000 Crohn's disease of small intestine without complications: Secondary | ICD-10-CM | POA: Diagnosis not present

## 2017-08-30 HISTORY — PX: COLONOSCOPY WITH PROPOFOL: SHX5780

## 2017-08-30 SURGERY — COLONOSCOPY WITH PROPOFOL
Anesthesia: General

## 2017-08-30 MED ORDER — PROPOFOL 500 MG/50ML IV EMUL
INTRAVENOUS | Status: DC | PRN
  Administered 2017-08-30: 120 ug/kg/min via INTRAVENOUS

## 2017-08-30 MED ORDER — PROPOFOL 500 MG/50ML IV EMUL
INTRAVENOUS | Status: AC
  Filled 2017-08-30: qty 50

## 2017-08-30 MED ORDER — DIPHENHYDRAMINE HCL 50 MG/ML IJ SOLN
INTRAMUSCULAR | Status: AC
  Filled 2017-08-30: qty 1

## 2017-08-30 MED ORDER — PROPOFOL 10 MG/ML IV BOLUS
INTRAVENOUS | Status: DC | PRN
  Administered 2017-08-30: 100 mg via INTRAVENOUS

## 2017-08-30 MED ORDER — DIPHENHYDRAMINE HCL 50 MG/ML IJ SOLN
INTRAMUSCULAR | Status: DC | PRN
  Administered 2017-08-30: 25 mg via INTRAVENOUS

## 2017-08-30 MED ORDER — SODIUM CHLORIDE 0.9 % IV SOLN
INTRAVENOUS | Status: DC
  Administered 2017-08-30: 09:00:00 via INTRAVENOUS

## 2017-08-30 MED ORDER — PHENYLEPHRINE HCL 10 MG/ML IJ SOLN
INTRAMUSCULAR | Status: AC
  Filled 2017-08-30: qty 1

## 2017-08-30 NOTE — Anesthesia Postprocedure Evaluation (Signed)
Anesthesia Post Note  Patient: Alexander Bishop  Procedure(s) Performed: COLONOSCOPY WITH PROPOFOL (N/A )  Patient location during evaluation: Endoscopy Anesthesia Type: General Level of consciousness: awake and alert Pain management: pain level controlled Vital Signs Assessment: post-procedure vital signs reviewed and stable Respiratory status: spontaneous breathing, nonlabored ventilation, respiratory function stable and patient connected to nasal cannula oxygen Cardiovascular status: blood pressure returned to baseline and stable Postop Assessment: no apparent nausea or vomiting Anesthetic complications: no     Last Vitals:  Vitals:   08/30/17 1034 08/30/17 1044  BP: 121/89 124/87  Pulse: 75 63  Resp: 18 20  Temp:    SpO2: 99% 100%    Last Pain:  Vitals:   08/30/17 1044  TempSrc:   PainSc: 0-No pain                 Precious Haws Piscitello

## 2017-08-30 NOTE — Transfer of Care (Signed)
Immediate Anesthesia Transfer of Care Note  Patient: Alexander Bishop  Procedure(s) Performed: COLONOSCOPY WITH PROPOFOL (N/A )  Patient Location: Endoscopy Unit  Anesthesia Type:General  Level of Consciousness: drowsy and patient cooperative  Airway & Oxygen Therapy: Patient Spontanous Breathing and Patient connected to nasal cannula oxygen  Post-op Assessment: Report given to RN and Post -op Vital signs reviewed and stable  Post vital signs: Reviewed and stable  Last Vitals:  Vitals Value Taken Time  BP 105/83 08/30/2017 10:15 AM  Temp 36.1 C 08/30/2017 10:14 AM  Pulse 76 08/30/2017 10:19 AM  Resp 18 08/30/2017 10:19 AM  SpO2 100 % 08/30/2017 10:19 AM  Vitals shown include unvalidated device data.  Last Pain:  Vitals:   08/30/17 1014  TempSrc: Tympanic  PainSc: 0-No pain         Complications: No apparent anesthesia complications

## 2017-08-30 NOTE — H&P (Signed)
Alexander Darby, MD [address]  [place]  [place], [place] [address]  Main: [phone]  Fax: [phone] Pager: [phone]  Primary Care Physician:  Burnard Hawthorne, FNP Primary Gastroenterologist:  Dr. Cephas Bishop  Pre-Procedure History & Physical: HPI:  Alexander Bishop is a 52 y.o. male is here for an colonoscopy.   Past Medical History:  Diagnosis Date  . Allergy   . Crohn disease ([place])   . History of chicken pox   . Rosacea     Past Surgical History:  Procedure Laterality Date  . BOWEL RESECTION  1993  . COLONOSCOPY    . COLONOSCOPY WITH PROPOFOL N/A 12/03/2014   Procedure: COLONOSCOPY WITH PROPOFOL;  Surgeon: Lollie Sails, MD;  Location: [facility] ENDOSCOPY;  Service: Endoscopy;  Laterality: N/A;    Prior to Admission medications   Medication Sig Start Date End Date Taking? Authorizing Provider  cyanocobalamin (,VITAMIN B-12,) 1000 MCG/ML injection Administer 1 mL weekly for 4 weeks, 69m every other week, then once monthly 10/08/16  Yes Khristine Verno, RTally Due MD  Syringe/Needle, Disp, (SYRINGE 3CC/25GX1") 25G X 1" 3 ML MISC Use as directed , to inject B12 07/28/17  Yes Arnett, MYvetta Coder FNP  tadalafil (CIALIS) 20 MG tablet Take 0.5 tablets (10 mg total) by mouth every other day as needed for erectile dysfunction. Max: 265mdose up to 1 dose/24h. Effects may last 36 hours. 04/23/17  Yes ArBurnard HawthorneFNP  ketoconazole (NIZORAL) 2 % shampoo  08/06/16   [provider]    Allergies as of 08/03/2017  . (No Known Allergies)    Family History  Problem Relation Age of Onset  . Hyperlipidemia Father   . Heart disease Father   . Arthritis Paternal Grandmother   . Arthritis Paternal Grandfather     Social History   Socioeconomic History  . Marital status: Married    Spouse name: Not on file  . Number of children: Not on file  . Years of education: Not on file  . Highest education level: Not on file  Occupational History  .  Occupation: PrGovernment social research officerSocial Needs  . Financial resource strain: Not on file  . Food insecurity:    Worry: Not on file    Inability: Not on file  . Transportation needs:    Medical: Not on file    Non-medical: Not on file  Tobacco Use  . Smoking status: Never Smoker  . Smokeless tobacco: Current User    Types: Snuff  . Tobacco comment: Grizzly wintergreen Long cut; 1 can/day.   Substance and Sexual Activity  . Alcohol use: Yes    Alcohol/week: 0.0 standard drinks    Comment: Drinks ~ 8 beers on the weekends.   . Drug use: No  . Sexual activity: Yes    Comment: Married.   Lifestyle  . Physical activity:    Days per week: Not on file    Minutes per session: Not on file  . Stress: Not on file  Relationships  . Social connections:    Talks on phone: Not on file    Gets together: Not on file    Attends religious service: Not on file    Active member of club or organization: Not on file    Attends meetings of clubs or organizations: Not on file    Relationship status: Not on file  . Intimate partner violence:    Fear of current or ex partner: Not on file  Emotionally abused: Not on file    Physically abused: Not on file    Forced sexual activity: Not on file  Other Topics Concern  . Not on file  Social History Narrative   Government social research officer; Nurse, children's.   Married.   Daughter Brooke Bonito at [employer], cheerleading   Son     Review of Systems: See HPI, otherwise negative ROS  Physical Exam: BP 113/88   Pulse 88   Temp (!) 96.8 F (36 C) (Tympanic)   Resp 18   Ht 5' 8"  (1.727 m)   Wt 70.3 kg   SpO2 100%   BMI 23.57 kg/m  General:   Alert,  pleasant and cooperative in NAD Head:  Normocephalic and atraumatic. Neck:  Supple; no masses or thyromegaly. Lungs:  Clear throughout to auscultation.    Heart:  Regular rate and rhythm. Abdomen:  Soft, nontender and nondistended. Normal bowel sounds, without guarding, and without rebound.   Neurologic:  Alert and   oriented x4;  grossly normal neurologically.  Impression/Plan: Alexander Bishop is here for an colonoscopy to be performed for h/o ileocolonic crohn's   Risks, benefits, limitations, and alternatives regarding  colonoscopy have been reviewed with the patient.  Questions have been answered.  All parties agreeable.   Sherri Sear, MD  08/30/2017, 8:53 AM

## 2017-08-30 NOTE — Op Note (Signed)
[facility] Patient Name: Alexander Bishop Procedure Date: 08/30/2017 9:17 AM MRN: [id number] Account #: [id number] Date of Birth: [birth year] Admit Type: Outpatient Age: 52 Room: [facility] ENDO ROOM 2 Gender: Male Note Status: Finalized Procedure:            Colonoscopy Indications:          Follow-up of Crohn's disease of the small bowel Providers:            Lin Landsman MD, MD Referring MD:         Yvetta Coder. Arnett (Referring MD) Medicines:            Monitored Anesthesia Care Complications:        No immediate complications. Estimated blood loss:                        Minimal. Procedure:            Pre-Anesthesia Assessment:                       - Prior to the procedure, a History and Physical was                        performed, and patient medications and allergies were                        reviewed. The patient is competent. The risks and                        benefits of the procedure and the sedation options and                        risks were discussed with the patient. All questions                        were answered and informed consent was obtained.                        Patient identification and proposed procedure were                        verified by the physician, the nurse, the                        anesthesiologist, the anesthetist and the technician in                        the pre-procedure area in the procedure room in the                        endoscopy suite. Mental Status Examination: alert and                        oriented. Airway Examination: normal oropharyngeal                        airway and neck mobility. Respiratory Examination:                        clear to auscultation. CV Examination: normal.  Prophylactic Antibiotics: The patient does not require                        prophylactic antibiotics. Prior Anticoagulants: The                        patient has taken no  previous anticoagulant or                        antiplatelet agents. ASA Grade Assessment: II - A                        patient with mild systemic disease. After reviewing the                        risks and benefits, the patient was deemed in                        satisfactory condition to undergo the procedure. The                        anesthesia plan was to use monitored anesthesia care                        (MAC). Immediately prior to administration of                        medications, the patient was re-assessed for adequacy                        to receive sedatives. The heart rate, respiratory rate,                        oxygen saturations, blood pressure, adequacy of                        pulmonary ventilation, and response to care were                        monitored throughout the procedure. The physical status                        of the patient was re-assessed after the procedure.                       After obtaining informed consent, the colonoscope was                        passed under direct vision. Throughout the procedure,                        the patient's blood pressure, pulse, and oxygen                        saturations were monitored continuously. The                        Colonoscope was introduced through the anus and  advanced to the the ileocolonic anastomosis. The                        colonoscopy was performed with moderate difficulty due                        to bowel stenosis. Successful completion of the                        procedure was aided by performing the maneuvers                        documented (below) in this report. The patient                        tolerated the procedure well. The quality of the bowel                        preparation was adequate. Findings:      Pediatric colonoscope was used      Skin tags were found on perianal exam.      There was evidence of a prior end-to-side  ileo-colonic anastomosis in       the ascending colon. The neo-terminal ileum was connected to colon 5cm       distal to the blind end. This was non-patent and was characterized by       moderate stenosis. The anastomosis could not be traversed. A TTS dilator       was passed through the scope. Dilation with a 10-30-10 mm colonic       balloon dilator was performed. The dilation site was examined following       endoscope reinsertion and showed mild improvement in luminal narrowing.       Estimated blood loss was minimal. Scope could not be traversed post       dilation. The mucosa in the neo-terminal ileum by peeking through the       stenosis appeared normal. Biopsies were performed      Normal mucosa was found in the entire colon. Biopsies were taken with a       cold forceps for histology.      The retroflexed view of the distal rectum and anal verge was normal and       showed no anal or rectal abnormalities. Impression:           - Perianal skin tags found on perianal exam.                       - Non-patent end-to-side ileo-colonic anastomosis,                        characterized by moderate stenosis. Dilated.                       - There was no endoscopic evidence of post-op                        recurrence of crohn's disease                       - Normal mucosa in the entire examined colon. Biopsied.                       -  The distal rectum and anal verge are normal on                        retroflexion view. Recommendation:       - Discharge patient to home (with spouse).                       - Resume regular diet today.                       - Continue present medications.                       - Await pathology results.                       - Recommend CTE                       - Continue B12 injections Procedure Code(s):    --- Professional ---                       [phone], Colonoscopy, flexible; with transendoscopic                        balloon dilation                        45380, Colonoscopy, flexible; with biopsy, single or                        multiple Diagnosis Code(s):    --- Professional ---                       K91.89, Other postprocedural complications and                        disorders of digestive system                       K64.4, Residual hemorrhoidal skin tags                       K50.00, Crohn's disease of small intestine without                        complications CPT copyright 2017 American Medical Association. All rights reserved. The codes documented in this report are preliminary and upon coder review may  be revised to meet current compliance requirements. Dr. Ulyess Mort Lin Landsman MD, MD 08/30/2017 10:20:14 AM This report has been signed electronically. Number of Addenda: 0 Note Initiated On: 08/30/2017 9:17 AM Scope Withdrawal Time: 0 hours 35 minutes 35 seconds  Total Procedure Duration: 0 hours 38 minutes 33 seconds       [facility]

## 2017-08-30 NOTE — Anesthesia Post-op Follow-up Note (Signed)
Anesthesia QCDR form completed.        

## 2017-08-30 NOTE — Anesthesia Preprocedure Evaluation (Signed)
Anesthesia Evaluation  Patient identified by MRN, date of birth, ID band Patient awake    Reviewed: Allergy & Precautions, H&P , NPO status , Patient's Chart, lab work & pertinent test results  History of Anesthesia Complications Negative for: history of anesthetic complications  Airway Mallampati: III  TM Distance: >3 FB Neck ROM: full    Dental  (+) Chipped   Pulmonary neg pulmonary ROS, neg shortness of breath,           Cardiovascular (-) Past MI negative cardio ROS       Neuro/Psych negative neurological ROS  negative psych ROS   GI/Hepatic negative GI ROS, Neg liver ROS,   Endo/Other  negative endocrine ROS  Renal/GU negative Renal ROS  negative genitourinary   Musculoskeletal   Abdominal   Peds  Hematology negative hematology ROS (+)   Anesthesia Other Findings Past Medical History: No date: Allergy No date: Crohn disease ([place]) No date: History of chicken pox No date: Rosacea  Past Surgical History: 1993: BOWEL RESECTION No date: COLONOSCOPY 12/03/2014: COLONOSCOPY WITH PROPOFOL; N/A     Comment:  Procedure: COLONOSCOPY WITH PROPOFOL;  Surgeon: Lollie Sails, MD;  Location: [facility] ENDOSCOPY;  Service:               Endoscopy;  Laterality: N/A;  BMI    Body Mass Index:  23.57 kg/m      Reproductive/Obstetrics negative OB ROS                             Anesthesia Physical Anesthesia Plan  ASA: II  Anesthesia Plan: General   Post-op Pain Management:    Induction: Intravenous  PONV Risk Score and Plan:   Airway Management Planned: Natural Airway and Nasal Cannula  Additional Equipment:   Intra-op Plan:   Post-operative Plan:   Informed Consent: I have reviewed the patients History and Physical, chart, labs and discussed the procedure including the risks, benefits and alternatives for the proposed anesthesia with the patient or authorized  representative who has indicated his/her understanding and acceptance.   Dental Advisory Given  Plan Discussed with: Anesthesiologist, CRNA and Surgeon  Anesthesia Plan Comments: (Patient consented for risks of anesthesia including but not limited to:  - adverse reactions to medications - risk of intubation if required - damage to teeth, lips or other oral mucosa - sore throat or hoarseness - Damage to heart, brain, lungs or loss of life  Patient voiced understanding.)        Anesthesia Quick Evaluation

## 2017-08-31 ENCOUNTER — Encounter: Payer: Self-pay | Admitting: Gastroenterology

## 2017-09-01 DIAGNOSIS — H5213 Myopia, bilateral: Secondary | ICD-10-CM | POA: Diagnosis not present

## 2017-09-01 LAB — SURGICAL PATHOLOGY

## 2017-10-01 ENCOUNTER — Other Ambulatory Visit (INDEPENDENT_AMBULATORY_CARE_PROVIDER_SITE_OTHER): Payer: 59

## 2017-10-01 DIAGNOSIS — Z0000 Encounter for general adult medical examination without abnormal findings: Secondary | ICD-10-CM | POA: Diagnosis not present

## 2017-10-01 NOTE — Addendum Note (Signed)
Addended by: Leeanne Rio on: 10/01/2017 08:24 AM   Modules accepted: Orders

## 2017-10-02 LAB — COMPREHENSIVE METABOLIC PANEL
AG Ratio: 1.5 (calc) (ref 1.0–2.5)
ALBUMIN MSPROF: 4 g/dL (ref 3.6–5.1)
ALKALINE PHOSPHATASE (APISO): 56 U/L (ref 40–115)
ALT: 17 U/L (ref 9–46)
AST: 22 U/L (ref 10–35)
BUN: 11 mg/dL (ref 7–25)
CO2: 20 mmol/L (ref 20–32)
CREATININE: 1.24 mg/dL (ref 0.70–1.33)
Calcium: 9 mg/dL (ref 8.6–10.3)
Chloride: 109 mmol/L (ref 98–110)
GLUCOSE: 89 mg/dL (ref 65–99)
Globulin: 2.7 g/dL (calc) (ref 1.9–3.7)
POTASSIUM: 3.6 mmol/L (ref 3.5–5.3)
Sodium: 140 mmol/L (ref 135–146)
Total Bilirubin: 0.6 mg/dL (ref 0.2–1.2)
Total Protein: 6.7 g/dL (ref 6.1–8.1)

## 2017-10-02 LAB — LIPID PANEL
CHOL/HDL RATIO: 2.8 (calc) (ref <5.0)
Cholesterol: 146 mg/dL (ref <200)
HDL: 53 mg/dL (ref >40)
LDL Cholesterol (Calc): 77 mg/dL (calc)
Non-HDL Cholesterol (Calc): 93 mg/dL (calc) (ref <130)
Triglycerides: 84 mg/dL (ref <150)

## 2017-10-02 LAB — PSA: PSA: 0.6 ng/mL (ref <=4.0)

## 2017-10-02 LAB — B12 AND FOLATE PANEL
FOLATE: 11.5 ng/mL
VITAMIN B 12: 345 pg/mL (ref 200–1100)

## 2018-05-26 ENCOUNTER — Other Ambulatory Visit: Payer: Self-pay | Admitting: Family

## 2018-05-26 DIAGNOSIS — N521 Erectile dysfunction due to diseases classified elsewhere: Secondary | ICD-10-CM

## 2018-07-20 ENCOUNTER — Other Ambulatory Visit: Payer: Self-pay

## 2018-08-01 ENCOUNTER — Other Ambulatory Visit: Payer: Self-pay

## 2018-08-01 ENCOUNTER — Encounter: Payer: Self-pay | Admitting: Family

## 2018-08-01 ENCOUNTER — Ambulatory Visit (INDEPENDENT_AMBULATORY_CARE_PROVIDER_SITE_OTHER): Payer: 59 | Admitting: Family

## 2018-08-01 VITALS — BP 120/80 | HR 58 | Temp 98.0°F | Ht 68.0 in | Wt 156.0 lb

## 2018-08-01 DIAGNOSIS — Z0001 Encounter for general adult medical examination with abnormal findings: Secondary | ICD-10-CM | POA: Diagnosis not present

## 2018-08-01 DIAGNOSIS — K50012 Crohn's disease of small intestine with intestinal obstruction: Secondary | ICD-10-CM | POA: Diagnosis not present

## 2018-08-01 DIAGNOSIS — M25442 Effusion, left hand: Secondary | ICD-10-CM

## 2018-08-01 DIAGNOSIS — Z0000 Encounter for general adult medical examination without abnormal findings: Secondary | ICD-10-CM

## 2018-08-01 LAB — CBC WITH DIFFERENTIAL/PLATELET
Basophils Absolute: 0 10*3/uL (ref 0.0–0.1)
Basophils Relative: 0.8 % (ref 0.0–3.0)
Eosinophils Absolute: 0.2 10*3/uL (ref 0.0–0.7)
Eosinophils Relative: 2.9 % (ref 0.0–5.0)
HCT: 46.6 % (ref 39.0–52.0)
Hemoglobin: 16 g/dL (ref 13.0–17.0)
Lymphocytes Relative: 23.8 % (ref 12.0–46.0)
Lymphs Abs: 1.4 10*3/uL (ref 0.7–4.0)
MCHC: 34.3 g/dL (ref 30.0–36.0)
MCV: 90.3 fl (ref 78.0–100.0)
Monocytes Absolute: 0.4 10*3/uL (ref 0.1–1.0)
Monocytes Relative: 7.6 % (ref 3.0–12.0)
Neutro Abs: 3.8 10*3/uL (ref 1.4–7.7)
Neutrophils Relative %: 64.9 % (ref 43.0–77.0)
Platelets: 232 10*3/uL (ref 150.0–400.0)
RBC: 5.17 Mil/uL (ref 4.22–5.81)
RDW: 13.5 % (ref 11.5–15.5)
WBC: 5.8 10*3/uL (ref 4.0–10.5)

## 2018-08-01 LAB — LIPID PANEL
Cholesterol: 150 mg/dL (ref 0–200)
HDL: 52.3 mg/dL (ref >39.00)
LDL Cholesterol: 79 mg/dL (ref 0–99)
NonHDL: 97.94
Total CHOL/HDL Ratio: 3
Triglycerides: 95 mg/dL (ref 0.0–149.0)
VLDL: 19 mg/dL (ref 0.0–40.0)

## 2018-08-01 LAB — TSH: TSH: 2.24 u[IU]/mL (ref 0.35–4.50)

## 2018-08-01 LAB — COMPREHENSIVE METABOLIC PANEL
ALT: 15 U/L (ref 0–53)
AST: 17 U/L (ref 0–37)
Albumin: 4.2 g/dL (ref 3.5–5.2)
Alkaline Phosphatase: 53 U/L (ref 39–117)
BUN: 12 mg/dL (ref 6–23)
CO2: 25 mEq/L (ref 19–32)
Calcium: 8.8 mg/dL (ref 8.4–10.5)
Chloride: 108 mEq/L (ref 96–112)
Creatinine, Ser: 1.2 mg/dL (ref 0.40–1.50)
GFR: 63.71 mL/min (ref >60.00)
Glucose, Bld: 89 mg/dL (ref 70–99)
Potassium: 4.2 mEq/L (ref 3.5–5.1)
Sodium: 140 mEq/L (ref 135–145)
Total Bilirubin: 0.6 mg/dL (ref 0.2–1.2)
Total Protein: 6.4 g/dL (ref 6.0–8.3)

## 2018-08-01 LAB — PSA: PSA: 0.45 ng/mL (ref 0.10–4.00)

## 2018-08-01 LAB — B12 AND FOLATE PANEL
Folate: 5.3 ng/mL — ABNORMAL LOW (ref >5.9)
Vitamin B-12: 115 pg/mL — ABNORMAL LOW (ref 211–911)

## 2018-08-01 LAB — VITAMIN D 25 HYDROXY (VIT D DEFICIENCY, FRACTURES): VITD: 32.05 ng/mL (ref 30.00–100.00)

## 2018-08-01 LAB — HEMOGLOBIN A1C: Hgb A1c MFr Bld: 4.8 % (ref 4.6–6.5)

## 2018-08-01 NOTE — Patient Instructions (Signed)
Let me know if finger swelling doesn't improve with ice, motrin and certainly if needs xray or orthopedic consult.   Health Maintenance, Male Adopting a healthy lifestyle and getting preventive care are important in promoting health and wellness. Ask your health care provider about:  The right schedule for you to have regular tests and exams.  Things you can do on your own to prevent diseases and keep yourself healthy. What should I know about diet, weight, and exercise? Eat a healthy diet   Eat a diet that includes plenty of vegetables, fruits, low-fat dairy products, and lean protein.  Do not eat a lot of foods that are high in solid fats, added sugars, or sodium. Maintain a healthy weight Body mass index (BMI) is a measurement that can be used to identify possible weight problems. It estimates body fat based on height and weight. Your health care provider can help determine your BMI and help you achieve or maintain a healthy weight. Get regular exercise Get regular exercise. This is one of the most important things you can do for your health. Most adults should:  Exercise for at least 150 minutes each week. The exercise should increase your heart rate and make you sweat (moderate-intensity exercise).  Do strengthening exercises at least twice a week. This is in addition to the moderate-intensity exercise.  Spend less time sitting. Even light physical activity can be beneficial. Watch cholesterol and blood lipids Have your blood tested for lipids and cholesterol at 51 years of age, then have this test every 5 years. You may need to have your cholesterol levels checked more often if:  Your lipid or cholesterol levels are high.  You are older than 51 years of age.  You are at high risk for heart disease. What should I know about cancer screening? Many types of cancers can be detected early and may often be prevented. Depending on your health history and family history, you may  need to have cancer screening at various ages. This may include screening for:  Colorectal cancer.  Prostate cancer.  Skin cancer.  Lung cancer. What should I know about heart disease, diabetes, and high blood pressure? Blood pressure and heart disease  High blood pressure causes heart disease and increases the risk of stroke. This is more likely to develop in people who have high blood pressure readings, are of African descent, or are overweight.  Talk with your health care provider about your target blood pressure readings.  Have your blood pressure checked: ? Every 3-5 years if you are 32-21 years of age. ? Every year if you are 61 years old or older.  If you are between the ages of 69 and 32 and are a current or former smoker, ask your health care provider if you should have a one-time screening for abdominal aortic aneurysm (AAA). Diabetes Have regular diabetes screenings. This checks your fasting blood sugar level. Have the screening done:  Once every three years after age 42 if you are at a normal weight and have a low risk for diabetes.  More often and at a younger age if you are overweight or have a high risk for diabetes. What should I know about preventing infection? Hepatitis B If you have a higher risk for hepatitis B, you should be screened for this virus. Talk with your health care provider to find out if you are at risk for hepatitis B infection. Hepatitis C Blood testing is recommended for:  Everyone born from 30 through  [place] with known risk factors for hepatitis C. Sexually transmitted infections (STIs)  You should be screened each year for STIs, including gonorrhea and chlamydia, if: ? You are sexually active and are younger than 51 years of age. ? You are older than 51 years of age and your health care provider tells you that you are at risk for this type of infection. ? Your sexual activity has changed since you were last screened, and you are  at increased risk for chlamydia or gonorrhea. Ask your health care provider if you are at risk.  Ask your health care provider about whether you are at high risk for HIV. Your health care provider may recommend a prescription medicine to help prevent HIV infection. If you choose to take medicine to prevent HIV, you should first get tested for HIV. You should then be tested every 3 months for as long as you are taking the medicine. Follow these instructions at home: Lifestyle  Do not use any products that contain nicotine or tobacco, such as cigarettes, e-cigarettes, and chewing tobacco. If you need help quitting, ask your health care provider.  Do not use street drugs.  Do not share needles.  Ask your health care provider for help if you need support or information about quitting drugs. Alcohol use  Do not drink alcohol if your health care provider tells you not to drink.  If you drink alcohol: ? Limit how much you have to 0-2 drinks a day. ? Be aware of how much alcohol is in your drink. In the [place] one drink equals one 12 oz bottle of beer (355 mL), one 5 oz glass of wine (148 mL), or one 1 oz glass of hard liquor (44 mL). General instructions  Schedule regular health, dental, and eye exams.  Stay current with your vaccines.  Tell your health care provider if: ? You often feel depressed. ? You have ever been abused or do not feel safe at home. Summary  Adopting a healthy lifestyle and getting preventive care are important in promoting health and wellness.  Follow your health care provider's instructions about healthy diet, exercising, and getting tested or screened for diseases.  Follow your health care provider's instructions on monitoring your cholesterol and blood pressure. This information is not intended to replace advice given to you by your health care provider. Make sure you discuss any questions you have with your health care provider. Document Released: 07/04/2007  Document Revised: 12/29/2017 Document Reviewed: 12/29/2017 Elsevier Patient Education  2020 [employer].

## 2018-08-01 NOTE — Assessment & Plan Note (Signed)
At baseline. He will let me know of any concerns. He is established with Dr Marius Ditch.

## 2018-08-01 NOTE — Progress Notes (Signed)
Subjective:    Patient ID: Alexander Bishop, male    DOB: 1965/03/07, 53 y.o.   MRN: [id number]  CC: [name] is a 53 y.o. male who presents today for physical exam.    HPI: Complains of left 4th finger knuckle swollen x 3 weeks, some improvement. Only reason 'mentions it as would like to know if needs to get bigger wedding ring.'   Cannot close it all the way. Not painful. No numbness.No h/o gout. Hasnt tried ice, NSAIDs.  Taking cialis  Completed b12- started by Dr Marius Ditch  Crohns- chronic loose stools. Unchanged. No weight loss, bloody diarrhea. Not bothersome for him. Doesn't feel he needs to see GI at this time.      Colorectal  Cancer Screening: UTD ; 10 year repeat Prostate Cancer Screening: Has been discussed with patient. No trouble urinating, decreased stream.  Lung Cancer Screening: No 30 year pack year history and > 55 years.  Immunizations       Tetanus - utd         Labs: Screening labs today. Exercise: Gets regular exercise. No CP, SOB. Alcohol use: beer on weekends.  Smoking/tobacco use: using dip.  Declines wellbutrin, chantix at this time. Will consider.  Regular dental exams: UTD Skin: following with dermatology; [place] [facility]; no h/o skin cancer.  Wears seat belt: Yes.  HISTORY:  Past Medical History:  Diagnosis Date  . Allergy   . Crohn disease ([place])   . History of chicken pox   . Rosacea     Past Surgical History:  Procedure Laterality Date  . BOWEL RESECTION  1993  . COLONOSCOPY    . COLONOSCOPY WITH PROPOFOL N/A 12/03/2014   Procedure: COLONOSCOPY WITH PROPOFOL;  Surgeon: Lollie Sails, MD;  Location: [facility] ENDOSCOPY;  Service: Endoscopy;  Laterality: N/A;  . COLONOSCOPY WITH PROPOFOL N/A 08/30/2017   Procedure: COLONOSCOPY WITH PROPOFOL;  Surgeon: Lin Landsman, MD;  Location: [facility] ENDOSCOPY;  Service: Gastroenterology;  Laterality: N/A;   Family History  Problem Relation Age of Onset  . Hyperlipidemia Father    . Heart disease Father   . Arthritis Paternal Grandmother   . Arthritis Paternal Grandfather       ALLERGIES: Patient has no known allergies.  Current Outpatient Medications on File Prior to Visit  Medication Sig Dispense Refill  . Syringe/Needle, Disp, (SYRINGE 3CC/25GX1") 25G X 1" 3 ML MISC Use as directed , to inject B12 10 each 3  . tadalafil (ADCIRCA/CIALIS) 20 MG tablet TAKE 1/2 TABLET BY MOUTH EVERY OTHER DAY AS NEEDED FOR ERECTILE DYSFUNCTION. MAX 1 TAB PER 24HRS. MAY LAST 36 HRS. 10 tablet 5   No current facility-administered medications on file prior to visit.     Social History   Tobacco Use  . Smoking status: Never Smoker  . Smokeless tobacco: Current User    Types: Snuff  . Tobacco comment: Grizzly wintergreen Long cut; 1 can/day.   Substance Use Topics  . Alcohol use: Yes    Alcohol/week: 0.0 standard drinks    Comment: Drinks ~ 8 beers on the weekends.   . Drug use: No    Review of Systems  Constitutional: Negative for chills and fever.  HENT: Negative for congestion.   Respiratory: Negative for cough.   Cardiovascular: Negative for chest pain, palpitations and leg swelling.  Gastrointestinal: Negative for diarrhea, nausea and vomiting.  Genitourinary: Negative for difficulty urinating.  Musculoskeletal: Positive for arthralgias (left 4th finger). Negative for myalgias.  Skin:  Negative for rash.  Neurological: Negative for headaches.  Hematological: Negative for adenopathy.  Psychiatric/Behavioral: Negative for confusion.      Objective:    BP 120/80   Pulse (!) 58   Temp 98 F (36.7 C) (Oral)   Ht 5' 8"  (1.727 m)   Wt 156 lb (70.8 kg)   SpO2 98%   BMI 23.72 kg/m   BP Readings from Last 3 Encounters:  08/01/18 120/80  08/30/17 124/87  07/28/17 110/90   Wt Readings from Last 3 Encounters:  08/01/18 156 lb (70.8 kg)  08/30/17 155 lb (70.3 kg)  07/28/17 157 lb (71.2 kg)    Physical Exam Vitals signs reviewed.  Constitutional:       Appearance: He is well-developed.  Neck:     Thyroid: No thyroid mass or thyromegaly.  Cardiovascular:     Rate and Rhythm: Regular rhythm.     Heart sounds: Normal heart sounds.  Pulmonary:     Effort: Pulmonary effort is normal. No respiratory distress.     Breath sounds: Normal breath sounds. No wheezing, rhonchi or rales.  Musculoskeletal:     Left hand: He exhibits swelling. He exhibits normal range of motion, no tenderness, no bony tenderness and no laceration. Normal sensation noted. Normal strength noted.       Hands:     Comments: Left 4th PIP enlarged. Soft tissue swelling noted on ventral side. No bruising, increased warmth, erythema. Full ROM.   Lymphadenopathy:     Head:     Right side of head: No submental, submandibular, tonsillar, preauricular, posterior auricular or occipital adenopathy.     Left side of head: No submental, submandibular, tonsillar, preauricular, posterior auricular or occipital adenopathy.     Cervical: No cervical adenopathy.  Skin:    General: Skin is warm and dry.  Neurological:     Mental Status: He is alert.  Psychiatric:        Speech: Speech normal.        Behavior: Behavior normal.        Assessment & Plan:   Problem List Items Addressed This Visit      Digestive   Crohn's disease of small intestine with intestinal obstruction ([place])    At baseline. He will let me know of any concerns. He is established with Dr Marius Ditch.        Other   Annual physical exam - Primary    Declines prostate exam in the absence of symptoms. Encouraged exercise.       Relevant Orders   TSH   CBC with Differential/Platelet   Hemoglobin A1c   Comprehensive metabolic panel   Lipid panel   PSA   VITAMIN D 25 Hydroxy (Vit-D Deficiency, Fractures)   B12 and Folate Panel   Finger joint swelling, left    Declines XR. On exam, doesn't support fracture. Suspect localized inflammation and advised ice, ibuprofen. He will let me know if no improvement.            I have discontinued Salbador Fiveash. Palacios's ketoconazole and cyanocobalamin. I am also having him maintain his SYRINGE 3CC/25GX1" and tadalafil.   No orders of the defined types were placed in this encounter.   Return precautions given.   Risks, benefits, and alternatives of the medications and treatment plan prescribed today were discussed, and patient expressed understanding.   Education regarding symptom management and diagnosis given to patient on AVS.   Continue to follow with Burnard Hawthorne, FNP for routine health  maintenance.   [name] and I agreed with plan.   Mable Paris, FNP

## 2018-08-01 NOTE — Assessment & Plan Note (Signed)
Declines XR. On exam, doesn't support fracture. Suspect localized inflammation and advised ice, ibuprofen. He will let me know if no improvement.

## 2018-08-01 NOTE — Assessment & Plan Note (Signed)
Declines prostate exam in the absence of symptoms. Encouraged exercise.

## 2018-08-03 ENCOUNTER — Other Ambulatory Visit: Payer: Self-pay | Admitting: Family

## 2018-08-03 DIAGNOSIS — E538 Deficiency of other specified B group vitamins: Secondary | ICD-10-CM

## 2018-08-03 MED ORDER — CYANOCOBALAMIN 1000 MCG/ML IJ SOLN: INTRAMUSCULAR | 15 refills | Status: DC

## 2018-08-03 MED ORDER — FOLIC ACID 1 MG PO TABS: 1.0000 mg | ORAL_TABLET | Freq: Every day | ORAL | 0 refills | Status: DC

## 2018-09-29 DIAGNOSIS — H5213 Myopia, bilateral: Secondary | ICD-10-CM | POA: Diagnosis not present

## 2018-09-29 DIAGNOSIS — H52222 Regular astigmatism, left eye: Secondary | ICD-10-CM | POA: Diagnosis not present

## 2018-09-29 DIAGNOSIS — H524 Presbyopia: Secondary | ICD-10-CM | POA: Diagnosis not present

## 2018-11-02 ENCOUNTER — Other Ambulatory Visit: Payer: Self-pay

## 2018-11-02 ENCOUNTER — Other Ambulatory Visit (INDEPENDENT_AMBULATORY_CARE_PROVIDER_SITE_OTHER): Payer: 59

## 2018-11-02 DIAGNOSIS — E538 Deficiency of other specified B group vitamins: Secondary | ICD-10-CM | POA: Diagnosis not present

## 2018-11-02 LAB — B12 AND FOLATE PANEL
Folate: 19.6 ng/mL (ref >5.9)
Vitamin B-12: 294 pg/mL (ref 211–911)

## 2018-11-06 LAB — METHYLMALONIC ACID, SERUM: Methylmalonic Acid, Quant: 155 nmol/L (ref 87–318)

## 2018-11-06 LAB — HOMOCYSTEINE: Homocysteine: 11.1 umol/L (ref <11.4)

## 2018-11-06 LAB — ANTI-PARIETAL ANTIBODY: PARIETAL CELL AB SCREEN: NEGATIVE

## 2018-11-06 LAB — INTRINSIC FACTOR ANTIBODIES: Intrinsic Factor: NEGATIVE

## 2018-11-14 ENCOUNTER — Other Ambulatory Visit: Payer: Self-pay | Admitting: Family

## 2018-11-14 DIAGNOSIS — E538 Deficiency of other specified B group vitamins: Secondary | ICD-10-CM

## 2018-11-28 ENCOUNTER — Other Ambulatory Visit: Payer: Self-pay

## 2018-11-28 DIAGNOSIS — Z20822 Contact with and (suspected) exposure to covid-19: Secondary | ICD-10-CM

## 2018-12-01 LAB — NOVEL CORONAVIRUS, NAA: SARS-CoV-2, NAA: DETECTED — AB

## 2019-01-11 IMAGING — DX DG ANKLE COMPLETE 3+V*L*
4 series · 4 of 4 positions shown · non-contrast
Comparison: None

CLINICAL DATA: LEFT ankle pain and swelling, no known injury

EXAM:
LEFT ANKLE COMPLETE - 3+ VIEW

[ankle ap]
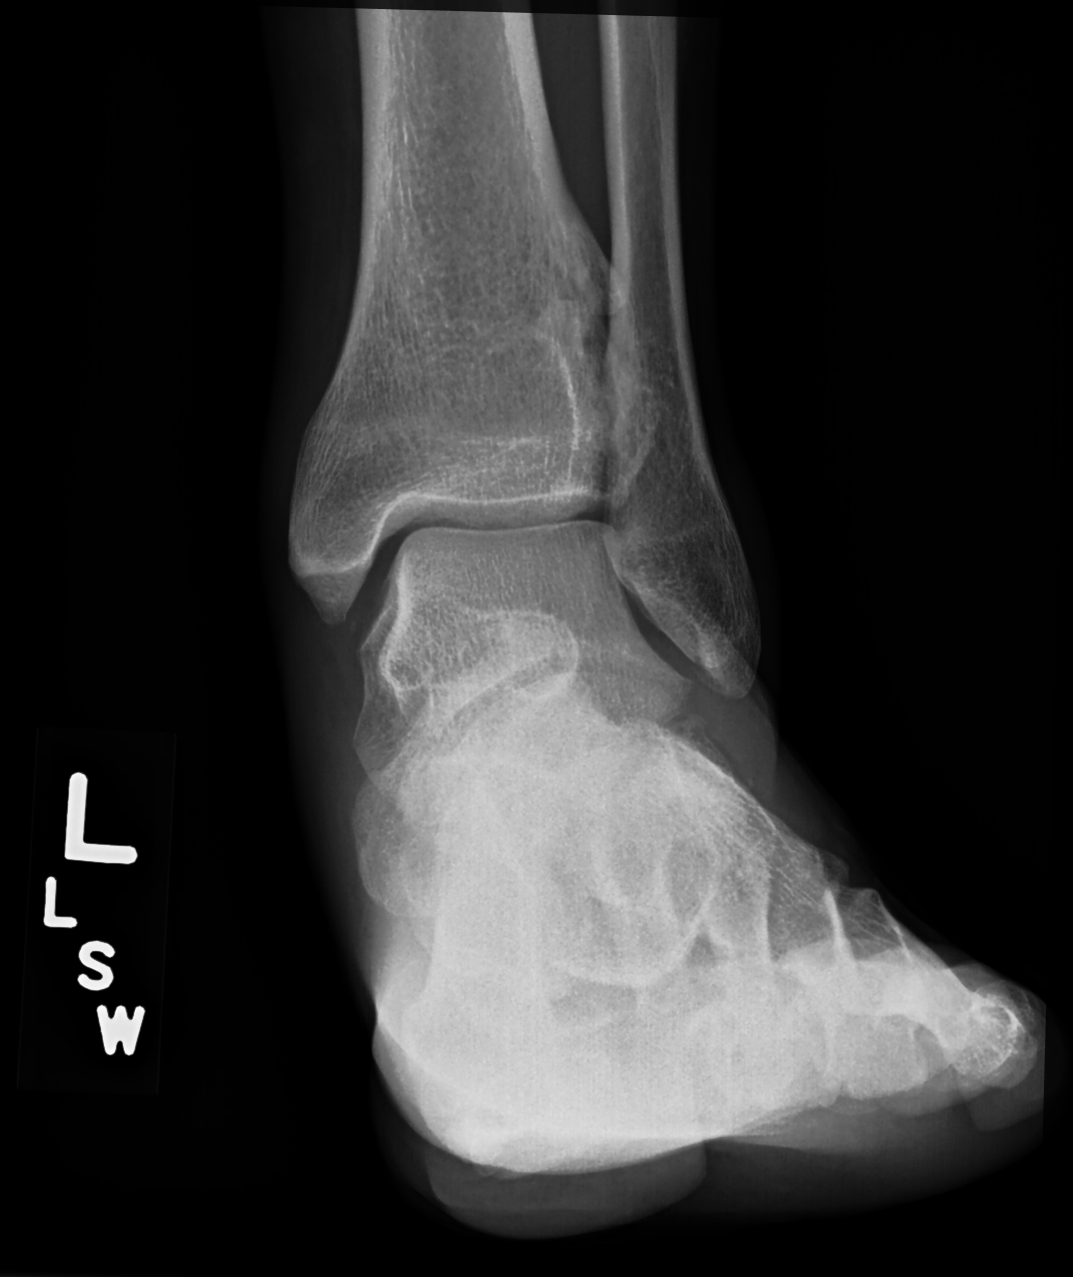

[ankle obl (oblique) (1 of 2)]
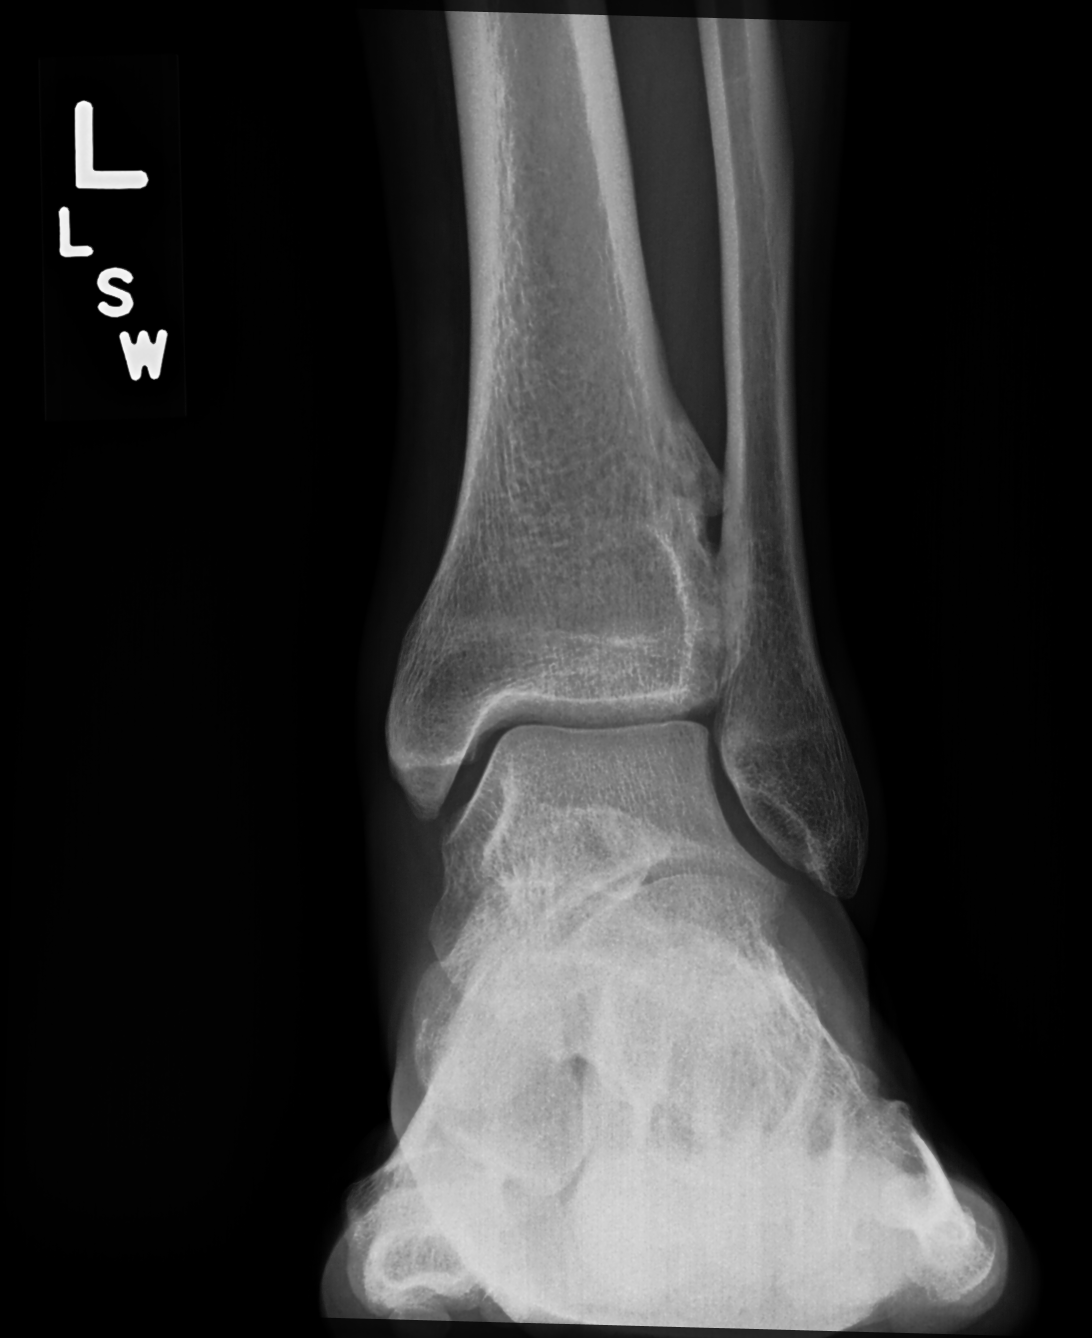

[ankle obl (oblique) (2 of 2)]
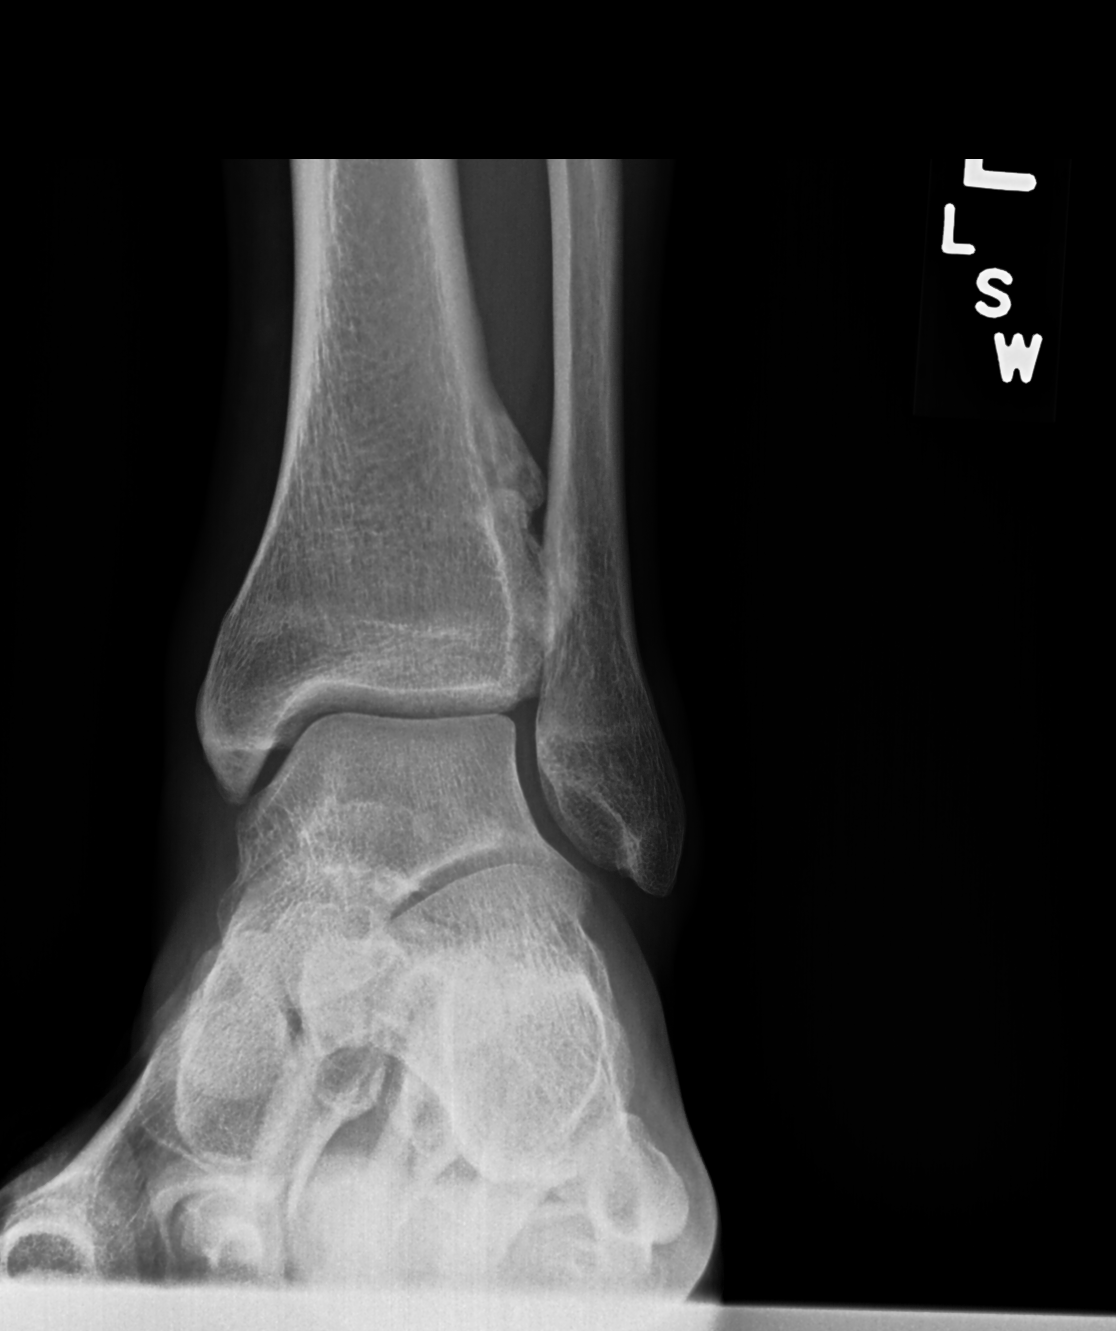

[ankle lat]
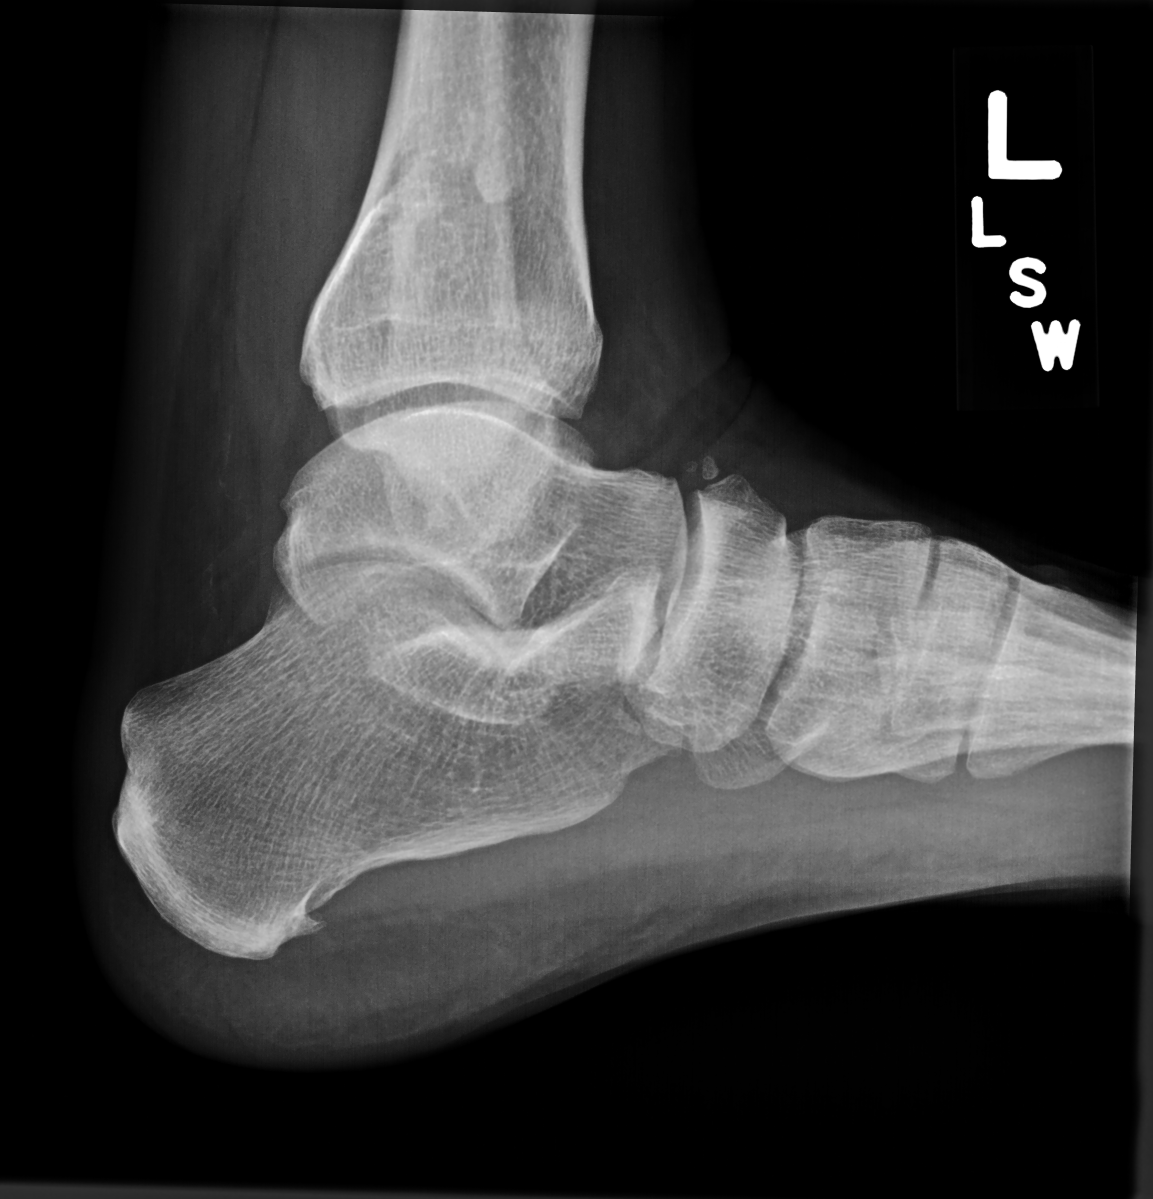

[4 of 4 positions shown; findings below may reference images not displayed]

FINDINGS: Osseous mineralization grossly normal for technique.

Joint spaces preserved.

Small plantar calcaneal spur.

Non fused corticated ossicles adjacent to cranial margin of
talonavicular joint.

Irregular calcification at the interosseous membrane adjacent to the
distal tibia.

No acute fracture, dislocation, or bone destruction.
IMPRESSION: No acute osseous abnormalities.

Small plantar calcaneal spur.

## 2019-02-22 ENCOUNTER — Other Ambulatory Visit: Payer: Self-pay | Admitting: Family Medicine

## 2019-02-22 DIAGNOSIS — E538 Deficiency of other specified B group vitamins: Secondary | ICD-10-CM

## 2019-03-15 DIAGNOSIS — H10413 Chronic giant papillary conjunctivitis, bilateral: Secondary | ICD-10-CM | POA: Diagnosis not present

## 2019-04-10 DIAGNOSIS — Z23 Encounter for immunization: Secondary | ICD-10-CM | POA: Diagnosis not present

## 2019-05-01 DIAGNOSIS — Z23 Encounter for immunization: Secondary | ICD-10-CM | POA: Diagnosis not present

## 2019-06-14 ENCOUNTER — Other Ambulatory Visit: Payer: Self-pay | Admitting: Family

## 2019-06-14 DIAGNOSIS — N521 Erectile dysfunction due to diseases classified elsewhere: Secondary | ICD-10-CM

## 2019-08-04 ENCOUNTER — Encounter: Payer: Self-pay | Admitting: Family

## 2019-08-04 ENCOUNTER — Telehealth: Payer: Self-pay | Admitting: Family

## 2019-08-04 ENCOUNTER — Encounter: Payer: 59 | Admitting: Family

## 2019-08-04 ENCOUNTER — Other Ambulatory Visit: Payer: Self-pay

## 2019-08-04 NOTE — Telephone Encounter (Signed)
Hi kathy Wanted to circle back Let me know when you have chance to call him to reschedule him, hopefully gift card due to having to reschedule his CPE this morning  Thanks for all your help here

## 2019-08-04 NOTE — Telephone Encounter (Signed)
Called tried to reschedule patient he stated he is going on vacation and he may reschedule upon his return. Gift cards sent.

## 2019-08-09 NOTE — Progress Notes (Signed)
This encounter was created in error - please disregard.

## 2019-11-16 ENCOUNTER — Other Ambulatory Visit: Payer: Self-pay

## 2019-11-16 ENCOUNTER — Other Ambulatory Visit: Payer: Self-pay | Admitting: Family

## 2019-11-16 ENCOUNTER — Ambulatory Visit (INDEPENDENT_AMBULATORY_CARE_PROVIDER_SITE_OTHER): Payer: 59 | Admitting: Family

## 2019-11-16 ENCOUNTER — Encounter: Payer: Self-pay | Admitting: Family

## 2019-11-16 VITALS — BP 116/82 | HR 88 | Temp 98.6°F | Ht 67.9 in | Wt 166.8 lb

## 2019-11-16 DIAGNOSIS — Z720 Tobacco use: Secondary | ICD-10-CM

## 2019-11-16 DIAGNOSIS — K5090 Crohn's disease, unspecified, without complications: Secondary | ICD-10-CM

## 2019-11-16 DIAGNOSIS — Z0000 Encounter for general adult medical examination without abnormal findings: Secondary | ICD-10-CM | POA: Diagnosis not present

## 2019-11-16 DIAGNOSIS — L719 Rosacea, unspecified: Secondary | ICD-10-CM | POA: Diagnosis not present

## 2019-11-16 DIAGNOSIS — E538 Deficiency of other specified B group vitamins: Secondary | ICD-10-CM | POA: Diagnosis not present

## 2019-11-16 MED ORDER — "SYRINGE/NEEDLE (DISP) 25G X 1"" 3 ML MISC": 2 refills | Status: DC

## 2019-11-16 MED ORDER — CYANOCOBALAMIN 1000 MCG/ML IJ SOLN: INTRAMUSCULAR | 3 refills | Status: DC

## 2019-11-16 MED ORDER — DOXYCYCLINE HYCLATE 100 MG PO TABS: 100.0000 mg | ORAL_TABLET | Freq: Two times a day (BID) | ORAL | 0 refills | Status: DC

## 2019-11-16 NOTE — Assessment & Plan Note (Addendum)
Benign oral exam. Advised to consult catie pharmD to discuss cessation.

## 2019-11-16 NOTE — Assessment & Plan Note (Signed)
Uncontrolled. Start doxycycline 115m bid for 4 weeks. Shortest , effective dose. He will follow up with dermatology. Education provided on sun avoidance, importance of probiotics.

## 2019-11-16 NOTE — Assessment & Plan Note (Signed)
Colonoscopy UTD. Encouraged continued exercise.

## 2019-11-16 NOTE — Patient Instructions (Addendum)
We will start shortest possible course of doxycycline for treatment of rosacea to avoid antibiotic resistance. This antibiotic makes it easier to sun burn so please use sun screen and sun protective clothing in combination for best protection. Continue following with dermatology  Ensure to take probiotics while on antibiotics and also for 2 weeks after completion. It is important to re-colonize the gut with good bacteria and also to prevent any diarrheal infections associated with antibiotic use.   Restart b12 injections at home.    Health Maintenance, Male Adopting a healthy lifestyle and getting preventive care are important in promoting health and wellness. Ask your health care provider about:  The right schedule for you to have regular tests and exams.  Things you can do on your own to prevent diseases and keep yourself healthy. What should I know about diet, weight, and exercise? Eat a healthy diet   Eat a diet that includes plenty of vegetables, fruits, low-fat dairy products, and lean protein.  Do not eat a lot of foods that are high in solid fats, added sugars, or sodium. Maintain a healthy weight Body mass index (BMI) is a measurement that can be used to identify possible weight problems. It estimates body fat based on height and weight. Your health care provider can help determine your BMI and help you achieve or maintain a healthy weight. Get regular exercise Get regular exercise. This is one of the most important things you can do for your health. Most adults should:  Exercise for at least 150 minutes each week. The exercise should increase your heart rate and make you sweat (moderate-intensity exercise).  Do strengthening exercises at least twice a week. This is in addition to the moderate-intensity exercise.  Spend less time sitting. Even light physical activity can be beneficial. Watch cholesterol and blood lipids Have your blood tested for lipids and cholesterol at 52  years of age, then have this test every 5 years. You may need to have your cholesterol levels checked more often if:  Your lipid or cholesterol levels are high.  You are older than 52 years of age.  You are at high risk for heart disease. What should I know about cancer screening? Many types of cancers can be detected early and may often be prevented. Depending on your health history and family history, you may need to have cancer screening at various ages. This may include screening for:  Colorectal cancer.  Prostate cancer.  Skin cancer.  Lung cancer. What should I know about heart disease, diabetes, and high blood pressure? Blood pressure and heart disease  High blood pressure causes heart disease and increases the risk of stroke. This is more likely to develop in people who have high blood pressure readings, are of African descent, or are overweight.  Talk with your health care provider about your target blood pressure readings.  Have your blood pressure checked: ? Every 3-5 years if you are 30-30 years of age. ? Every year if you are 13 years old or older.  If you are between the ages of 45 and 22 and are a current or former smoker, ask your health care provider if you should have a one-time screening for abdominal aortic aneurysm (AAA). Diabetes Have regular diabetes screenings. This checks your fasting blood sugar level. Have the screening done:  Once every three years after age 64 if you are at a normal weight and have a low risk for diabetes.  More often and at a younger age  if you are overweight or have a high risk for diabetes. What should I know about preventing infection? Hepatitis B If you have a higher risk for hepatitis B, you should be screened for this virus. Talk with your health care provider to find out if you are at risk for hepatitis B infection. Hepatitis C Blood testing is recommended for:  Everyone born from [birth year] through 1963.  Anyone with known  risk factors for hepatitis C. Sexually transmitted infections (STIs)  You should be screened each year for STIs, including gonorrhea and chlamydia, if: ? You are sexually active and are younger than 52 years of age. ? You are older than 52 years of age and your health care provider tells you that you are at risk for this type of infection. ? Your sexual activity has changed since you were last screened, and you are at increased risk for chlamydia or gonorrhea. Ask your health care provider if you are at risk.  Ask your health care provider about whether you are at high risk for HIV. Your health care provider may recommend a prescription medicine to help prevent HIV infection. If you choose to take medicine to prevent HIV, you should first get tested for HIV. You should then be tested every 3 months for as long as you are taking the medicine. Follow these instructions at home: Lifestyle  Do not use any products that contain nicotine or tobacco, such as cigarettes, e-cigarettes, and chewing tobacco. If you need help quitting, ask your health care provider.  Do not use street drugs.  Do not share needles.  Ask your health care provider for help if you need support or information about quitting drugs. Alcohol use  Do not drink alcohol if your health care provider tells you not to drink.  If you drink alcohol: ? Limit how much you have to 0-2 drinks a day. ? Be aware of how much alcohol is in your drink. In the [place] one drink equals one 12 oz bottle of beer (355 mL), one 5 oz glass of wine (148 mL), or one 1 oz glass of hard liquor (44 mL). General instructions  Schedule regular health, dental, and eye exams.  Stay current with your vaccines.  Tell your health care provider if: ? You often feel depressed. ? You have ever been abused or do not feel safe at home. Summary  Adopting a healthy lifestyle and getting preventive care are important in promoting health and wellness.  Follow  your health care provider's instructions about healthy diet, exercising, and getting tested or screened for diseases.  Follow your health care provider's instructions on monitoring your cholesterol and blood pressure. This information is not intended to replace advice given to you by your health care provider. Make sure you discuss any questions you have with your health care provider. Document Revised: 12/29/2017 Document Reviewed: 12/29/2017 Elsevier Patient Education  2020 [employer].

## 2019-11-16 NOTE — Assessment & Plan Note (Signed)
Symptomatically stable. Will follow

## 2019-11-16 NOTE — Assessment & Plan Note (Signed)
Likely life long supplementation due to h/o bowel resection. Advised to restart b12.

## 2019-11-16 NOTE — Progress Notes (Signed)
Subjective:    Patient ID: Alexander Bishop, male    DOB: Jun 18, 1965, 54 y.o.   MRN: [id number]  CC: Alexander Bishop is a 54 y.o. male who presents today for physical exam.    HPI: Feels well today No new concerns  Roseasa worse of late. He has papular lesions on left side of note. Not worse in sun or mask that he can tell. He would like a refill of doxycycline until he can see Dr Nehemiah Massed.  Follows with dermatology, upcoming appointment in Feb 2022.   H/o b12 deficiency   Crohns disease - No abdominal pain, diarrhea. he is not following with Dr Marius Ditch  Colorectal  Cancer Screening: UTD with Dr Marius Ditch whom advised continued b12 injections Prostate Cancer Screening: due Lung Cancer Screening: No 30 year pack year history and > 50 years to 26 years.      No family history of AAA.  Immunizations       Tetanus - utd      Hepatitis C screening - Candidate for, consents  Labs: Screening labs today. Exercise: Gets regular exercise and enjoys playing golf. No cp   Alcohol use:  occassional Smoking/tobacco use: Nonsmoker.   Dips constantly. Tried nicotine patch and gum and never helped.  Would like more information on wellbutrin. No h/o depression or anxiety. No si/hi   HISTORY:  Past Medical History:  Diagnosis Date  . Allergy   . Crohn disease ([place])   . History of chicken pox   . Rosacea     Past Surgical History:  Procedure Laterality Date  . BOWEL RESECTION  1993  . COLONOSCOPY    . COLONOSCOPY WITH PROPOFOL N/A 12/03/2014   Procedure: COLONOSCOPY WITH PROPOFOL;  Surgeon: Lollie Sails, MD;  [facility] ENDOSCOPY;  Service: Endoscopy;  Laterality: N/A;  . COLONOSCOPY WITH PROPOFOL N/A 08/30/2017   Procedure: COLONOSCOPY WITH PROPOFOL;  Surgeon: Lin Landsman, MD;  Location: [facility] ENDOSCOPY;  Service: Gastroenterology;  Laterality: N/A;   Family History  Problem Relation Age of Onset  . Hyperlipidemia Father   . Heart disease Father   . Arthritis  Paternal Grandmother   . Arthritis Paternal Grandfather       ALLERGIES: Patient has no known allergies.  Current Outpatient Medications on File Prior to Visit  Medication Sig Dispense Refill  . tadalafil (CIALIS) 20 MG tablet TAKE 1/2 TABLET BY MOUTH EVERY OTHER DAY AS NEEDED FOR ERECTILE DYSFUNCTION. MAX 1 TAB PER 24HRS. MAY LAST 36 HRS. 6 tablet 5   No current facility-administered medications on file prior to visit.    Social History   Tobacco Use  . Smoking status: Never Smoker  . Smokeless tobacco: Current User    Types: Snuff  . Tobacco comment: Grizzly wintergreen Long cut; 1 can/day.   Vaping Use  . Vaping Use: Never assessed  Substance Use Topics  . Alcohol use: Yes    Alcohol/week: 0.0 standard drinks    Comment: Drinks ~ 8 beers on the weekends.   . Drug use: No    Review of Systems  Constitutional: Negative for chills and fever.  HENT: Negative for congestion and mouth sores.   Respiratory: Negative for cough.   Cardiovascular: Negative for chest pain, palpitations and leg swelling.  Gastrointestinal: Negative for diarrhea, nausea and vomiting.  Genitourinary: Negative for difficulty urinating.  Musculoskeletal: Negative for myalgias.  Skin: Negative for rash.  Neurological: Negative for headaches.  Hematological: Negative for adenopathy.  Psychiatric/Behavioral: Negative for confusion.  Objective:    BP 116/82   Pulse 88   Temp 98.6 F (37 C)   Ht 5' 7.9" (1.725 m)   Wt 166 lb 12.8 oz (75.7 kg)   SpO2 99%   BMI 25.44 kg/m   BP Readings from Last 3 Encounters:  11/16/19 116/82  08/01/18 120/80  08/30/17 124/87   Wt Readings from Last 3 Encounters:  11/16/19 166 lb 12.8 oz (75.7 kg)  08/01/18 156 lb (70.8 kg)  08/30/17 155 lb (70.3 kg)    Physical Exam Vitals reviewed.  Constitutional:      Appearance: He is well-developed.  HENT:     Head:      Comments: Inspection and palpation of tongue and gums, no masses, lesions  appreciated.  Papular lesion noted with surrounding erythema left nasal fold as noted on diagram.  Neck:     Thyroid: No thyroid mass or thyromegaly.  Cardiovascular:     Rate and Rhythm: Regular rhythm.     Heart sounds: Normal heart sounds.  Pulmonary:     Effort: Pulmonary effort is normal. No respiratory distress.     Breath sounds: Normal breath sounds. No wheezing, rhonchi or rales.  Lymphadenopathy:     Head:     Right side of head: No submental, submandibular, tonsillar, preauricular, posterior auricular or occipital adenopathy.     Left side of head: No submental, submandibular, tonsillar, preauricular, posterior auricular or occipital adenopathy.     Cervical: No cervical adenopathy.  Skin:    General: Skin is warm and dry.  Neurological:     Mental Status: He is alert.  Psychiatric:        Speech: Speech normal.        Behavior: Behavior normal.        Assessment & Plan:   Problem List Items Addressed This Visit      Digestive   Crohn's disease without complication ([place])    Symptomatically stable. Will follow      Relevant Medications   cyanocobalamin (,VITAMIN B-12,) 1000 MCG/ML injection   Other Relevant Orders   B12 and Folate Panel     Musculoskeletal and Integument   Rosacea    Uncontrolled. Start doxycycline 124m bid for 4 weeks. Shortest , effective dose. He will follow up with dermatology. Education provided on sun avoidance, importance of probiotics.       Relevant Medications   doxycycline (VIBRA-TABS) 100 MG tablet     Other   Annual physical exam - Primary    Colonoscopy UTD. Encouraged continued exercise.       Relevant Orders   TSH   CBC with Differential/Platelet   Comprehensive metabolic panel   Hemoglobin A1c   Hepatitis C antibody   Lipid panel   VITAMIN D 25 Hydroxy (Vit-D Deficiency, Fractures)   PSA   B12 and Folate Panel   B12 deficiency    Likely life long supplementation due to h/o bowel resection. Advised to restart  b12.       Tobacco use    Benign oral exam. Advised to consult catie pharmD to discuss cessation.           I have discontinued MBrookport3CC/25GX1", cyanocobalamin, and folic acid. I am also having him start on doxycycline and cyanocobalamin. Additionally, I am having him maintain his tadalafil.   Meds ordered this encounter  Medications  . doxycycline (VIBRA-TABS) 100 MG tablet    Sig: Take 1 tablet (100 mg total) by mouth 2 (  two) times daily.    Dispense:  60 tablet    Refill:  0    Order Specific Question:   Supervising Provider    Answer:   Deborra Medina L [[id number]]  . cyanocobalamin (,VITAMIN B-12,) 1000 MCG/ML injection    Sig: 1000 mcg (1 mL) intramuscular injection in the thigh ( vastus lateralis) once per month.    Dispense:  4 mL    Refill:  3    Order Specific Question:   Supervising Provider    Answer:   Crecencio Mc [[id number]]    Return precautions given.   Risks, benefits, and alternatives of the medications and treatment plan prescribed today were discussed, and patient expressed understanding.   Education regarding symptom management and diagnosis given to patient on AVS.   Continue to follow with Burnard Hawthorne, FNP for routine health maintenance.   [name] and I agreed with plan.   Mable Paris, FNP

## 2019-11-17 LAB — LIPID PANEL
Cholesterol: 157 mg/dL (ref 0–200)
HDL: 48.4 mg/dL (ref >39.00)
LDL Cholesterol: 73 mg/dL (ref 0–99)
NonHDL: 109.07
Total CHOL/HDL Ratio: 3
Triglycerides: 179 mg/dL — ABNORMAL HIGH (ref 0.0–149.0)
VLDL: 35.8 mg/dL (ref 0.0–40.0)

## 2019-11-17 LAB — B12 AND FOLATE PANEL
Folate: 4.8 ng/mL — ABNORMAL LOW (ref >5.9)
Vitamin B-12: 179 pg/mL — ABNORMAL LOW (ref 211–911)

## 2019-11-17 LAB — COMPREHENSIVE METABOLIC PANEL
ALT: 25 U/L (ref 0–53)
AST: 21 U/L (ref 0–37)
Albumin: 4.2 g/dL (ref 3.5–5.2)
Alkaline Phosphatase: 61 U/L (ref 39–117)
BUN: 8 mg/dL (ref 6–23)
CO2: 23 mEq/L (ref 19–32)
Calcium: 8.9 mg/dL (ref 8.4–10.5)
Chloride: 105 mEq/L (ref 96–112)
Creatinine, Ser: 1.15 mg/dL (ref 0.40–1.50)
GFR: 73.04 mL/min (ref >60.00)
Glucose, Bld: 88 mg/dL (ref 70–99)
Potassium: 3.6 mEq/L (ref 3.5–5.1)
Sodium: 138 mEq/L (ref 135–145)
Total Bilirubin: 0.5 mg/dL (ref 0.2–1.2)
Total Protein: 6.7 g/dL (ref 6.0–8.3)

## 2019-11-17 LAB — CBC WITH DIFFERENTIAL/PLATELET
Basophils Absolute: 0.1 10*3/uL (ref 0.0–0.1)
Basophils Relative: 1 % (ref 0.0–3.0)
Eosinophils Absolute: 0.2 10*3/uL (ref 0.0–0.7)
Eosinophils Relative: 2.9 % (ref 0.0–5.0)
HCT: 47.5 % (ref 39.0–52.0)
Hemoglobin: 16.5 g/dL (ref 13.0–17.0)
Lymphocytes Relative: 23.5 % (ref 12.0–46.0)
Lymphs Abs: 2 10*3/uL (ref 0.7–4.0)
MCHC: 34.9 g/dL (ref 30.0–36.0)
MCV: 88.7 fl (ref 78.0–100.0)
Monocytes Absolute: 0.6 10*3/uL (ref 0.1–1.0)
Monocytes Relative: 6.7 % (ref 3.0–12.0)
Neutro Abs: 5.5 10*3/uL (ref 1.4–7.7)
Neutrophils Relative %: 65.9 % (ref 43.0–77.0)
Platelets: 255 10*3/uL (ref 150.0–400.0)
RBC: 5.35 Mil/uL (ref 4.22–5.81)
RDW: 13.7 % (ref 11.5–15.5)
WBC: 8.4 10*3/uL (ref 4.0–10.5)

## 2019-11-17 LAB — HEPATITIS C ANTIBODY
Hepatitis C Ab: NONREACTIVE
SIGNAL TO CUT-OFF: 0.06 (ref <1.00)

## 2019-11-17 LAB — TSH: TSH: 1.44 u[IU]/mL (ref 0.35–4.50)

## 2019-11-17 LAB — PSA: PSA: 0.54 ng/mL (ref 0.10–4.00)

## 2019-11-17 LAB — VITAMIN D 25 HYDROXY (VIT D DEFICIENCY, FRACTURES): VITD: 27 ng/mL — ABNORMAL LOW (ref 30.00–100.00)

## 2019-11-17 LAB — HEMOGLOBIN A1C: Hgb A1c MFr Bld: 5 % (ref 4.6–6.5)

## 2019-11-20 DIAGNOSIS — H5213 Myopia, bilateral: Secondary | ICD-10-CM | POA: Diagnosis not present

## 2019-11-22 ENCOUNTER — Encounter: Payer: Self-pay | Admitting: Family

## 2019-11-22 ENCOUNTER — Other Ambulatory Visit: Payer: Self-pay | Admitting: Family

## 2019-11-22 DIAGNOSIS — E538 Deficiency of other specified B group vitamins: Secondary | ICD-10-CM

## 2019-11-22 MED ORDER — FOLIC ACID 1 MG PO TABS: 1.0000 mg | ORAL_TABLET | Freq: Every day | ORAL | 3 refills | Status: DC

## 2019-11-23 NOTE — Progress Notes (Signed)
Pt is okay with referral to see Catie.

## 2019-11-24 ENCOUNTER — Other Ambulatory Visit: Payer: Self-pay | Admitting: Family

## 2019-11-24 DIAGNOSIS — Z720 Tobacco use: Secondary | ICD-10-CM

## 2019-11-27 ENCOUNTER — Telehealth: Payer: Self-pay

## 2019-11-27 NOTE — Chronic Care Management (AMB) (Signed)
  Care Management   Note  11/27/2019 Name: Alexander Bishop MRN: [id number] DOB: 1965/08/11  Alexander Bishop is a 54 y.o. year old male who is a primary care patient of Burnard Hawthorne, FNP. I reached out to [name] by phone today in response to a referral sent by [name]'s health plan.    [name] was given information about care management services today including:  1. Care management services include personalized support from designated clinical staff supervised by his physician, including individualized plan of care and coordination with other care providers 2. 24/7 contact phone numbers for assistance for urgent and routine care needs. 3. The patient may stop care management services at any time by phone call to the office staff.  Patient agreed to services and verbal consent obtained.   Follow up plan: Telephone appointment with care management team member scheduled for:12/18/2019  Noreene Larsson, [place], [place], [place] [address] Direct Dial: [phone] [email or web address]

## 2019-12-18 ENCOUNTER — Ambulatory Visit: Payer: 59 | Admitting: Pharmacist

## 2019-12-18 ENCOUNTER — Other Ambulatory Visit: Payer: Self-pay | Admitting: Family

## 2019-12-18 DIAGNOSIS — Z720 Tobacco use: Secondary | ICD-10-CM

## 2019-12-18 MED ORDER — VARENICLINE TARTRATE 1 MG PO TABS: ORAL_TABLET | ORAL | 2 refills | Status: DC

## 2019-12-18 NOTE — Patient Instructions (Addendum)
Alexander Bishop,   It was great talking with you today! We decided to start varenicline (generic Chantix). You will take 1/2 tablet (0.5 mg) once daily for 3 days, then increase to 1/2 tablet twice daily for 4 days, then increase to 1 tablet twice daily. Take this medication with food.   I do recommend calling the [place] Quitline and talking to the counselors about behavioral strategies to kick the habit of using dip. See the enclosed handout.  [place] also has some programs/support for employees: BrokenLung.it  Call me with any questions or concerns.   Catie Darnelle Maffucci, PharmD, [place], [place]    Visit Information  Patient Care Plan: Medication Management    Problem Identified: Tobacco Use     Long-Range Goal: Disease Progression Prevention   This Visit's Progress: On track  Priority: High  Note:   Current Barriers:  . Unable to independently cease tobacco use   Pharmacist Clinical Goal(s):  Marland Kitchen Over the next 90 days, patient will  work towards tobacco cessation  through collaboration with PharmD and provider.   Interventions: . Inter-disciplinary care team collaboration (see longitudinal plan of care) . Comprehensive medication review performed; medication list updated in electronic medical record  Tobacco Abuse: . 1 can of dip per day (roughly equivalent to 3-4 packs of cigarettes daily) ~30 years of use . Previous quit attempts: successful when he and college friends made a $1000 bet that they would quit smoking for a year. Immediately started again the day after the bet ended. Noted that he has tried patches for ~3-4 weeks and gum before, but never together . Triggers to dip: habit; especially when out with friends and they are using   . Motivation to quit using tobacco: wife would like him to quit for his health, he would like to make her happy. He also notes he wants to quit because it is not socially  acceptbale . Discussed benefit of pharmacotherapy + cognitive behavioral therapy. Discussed superior benefit of varenicline vs dual nicotine replacement therapy with patch + gum/lozenges. Patient elects to try varenicline. Counseled on administration and to take with food. Discussed with PCP Mable Paris, she is in agreement. Start varenicline 0.5 mg daily x 3 days, then 0.5 mg BID x 4 days, the 1 mg BID thereafter. Discussed setting a quit date within ~ 1 month after reaching steady dose.  Marland Kitchen Discussed [employer] Quitline. Information and brochure provided. Provided motivational interviewing regarding behavioral strategies to avoid tobacco use  Patient Goals/Self-Care Activities . Over the next 90 days, patient will:  - take medications as prescribed Collaborate with provider on tobacco cessation  Follow Up Plan: Telephone follow up appointment with care management team member scheduled for:~ 5 weeks     The patient verbalized understanding of instructions, educational materials, and care plan provided today and agreed to receive a mailed copy of patient instructions, educational materials, and care plan.    Plan: Telephone follow up appointment with care management team member scheduled for:~ 4 weeks  Catie Darnelle Maffucci, PharmD, [place], [place] Pharmacist [place] [phone]

## 2019-12-18 NOTE — Chronic Care Management (AMB) (Signed)
Care Management   Pharmacy Note  12/18/2019 Name: MESHACH PERRY MRN: [id number] DOB: [birth year]   Subjective:  DORN HARTSHORNE is a 54 y.o. year old male who is a primary care patient of Burnard Hawthorne, FNP. The Care Management/Care Coordination team team was consulted for assistance with care management and care coordination needs.    Engaged with patient by telephone for initial visit in response to provider referral for pharmacy case management and/or care coordination services.   Consent to Services:  Mr. Porcaro was given information about care management/care coordination services, agreed to services, and gave verbal consent prior to initiation of services. Please see initial visit note for detailed documentation.   Review of patient status, including review of consultants reports, laboratory and other test data, was performed as part of comprehensive evaluation and provision of chronic care management services.   SDOH (Social Determinants of Health) assessments and interventions performed:  SDOH Interventions     Most Recent Value  SDOH Interventions  SDOH Interventions for the Following Domains Tobacco  Financial Strain Interventions Intervention Not Indicated  Tobacco Interventions Cessation Materials Given and Reviewed       Objective:  Lab Results  Component Value Date   CREATININE 1.15 11/16/2019   CREATININE 1.20 08/01/2018   CREATININE 1.24 10/01/2017    Lab Results  Component Value Date   HGBA1C 5.0 11/16/2019       Component Value Date/Time   CHOL 157 11/16/2019 1525   TRIG 179.0 (H) 11/16/2019 1525   HDL 48.40 11/16/2019 1525   CHOLHDL 3 11/16/2019 1525   VLDL 35.8 11/16/2019 1525   LDLCALC 73 11/16/2019 1525   LDLCALC 77 10/01/2017 0824    Clinical ASCVD: No  The 10-year ASCVD risk score Mikey Bussing DC Jr., et al., 2013) is: 2.7%   Values used to calculate the score:     Age: 67 years     Sex: Male     Is Non-Hispanic African  American: No     Diabetic: No     Tobacco smoker: No     Systolic Blood Pressure: 858 mmHg     Is BP treated: No     HDL Cholesterol: 48.4 mg/dL     Total Cholesterol: 157 mg/dL     BP Readings from Last 3 Encounters:  11/16/19 116/82  08/01/18 120/80  08/30/17 124/87    Assessment:   No Known Allergies  Medications Reviewed Today    Reviewed by De Hollingshead, RPH-CPP (Pharmacist) on 12/18/19 at 1307  Med List Status: <None>  Medication Order Taking? Sig Documenting Provider Last Dose Status Informant  cyanocobalamin (,VITAMIN B-12,) 1000 MCG/ML injection [id number] Yes 1000 mcg (1 mL) intramuscular injection in the thigh ( vastus lateralis) once per month. Burnard Hawthorne, FNP Taking Active   folic acid (FOLVITE) 1 MG tablet [id number] Yes Take 1 tablet (1 mg total) by mouth daily. Burnard Hawthorne, FNP Taking Active   SYRINGE-NEEDLE, DISP, 3 ML 25G X 1" 3 ML MISC [id number] Yes Used to give monthly B-12 injections. Burnard Hawthorne, FNP Taking Active   tadalafil (CIALIS) 20 MG tablet [id number] Yes TAKE 1/2 TABLET BY MOUTH EVERY OTHER DAY AS NEEDED FOR ERECTILE DYSFUNCTION. MAX 1 TAB PER 24HRS. MAY LAST 36 HRS. Burnard Hawthorne, FNP Taking Active           Patient Active Problem List   Diagnosis Date Noted  . Finger joint swelling, left 08/01/2018  . Crohn's disease of small intestine  with intestinal obstruction ([place])   . B12 deficiency 04/23/2017  . Rosacea 08/08/2014  . Crohn's disease without complication ([place]) 19/14/7829  . Tobacco use 08/08/2014  . Annual physical exam 08/08/2014  . Male erectile dysfunction 08/08/2014    Medication Assistance: None required. Patient affirms current coverage meets needs.   Patient Care Plan: Medication Management    Problem Identified: Tobacco Use     Long-Range Goal: Disease Progression Prevention   This Visit's Progress: On track  Priority: High  Note:   Current Barriers:  . Unable to independently cease  tobacco use   Pharmacist Clinical Goal(s):  Marland Kitchen Over the next 90 days, patient will  work towards tobacco cessation  through collaboration with PharmD and provider.   Interventions: . Inter-disciplinary care team collaboration (see longitudinal plan of care) . Comprehensive medication review performed; medication list updated in electronic medical record  Tobacco Abuse: . 1 can of dip per day (roughly equivalent to 3-4 packs of cigarettes daily) ~30 years of use . Previous quit attempts: successful when he and college friends made a $1000 bet that they would quit smoking for a year. Immediately started again the day after the bet ended. Noted that he has tried patches for ~3-4 weeks and gum before, but never together . Triggers to dip: habit; especially when out with friends and they are using   . Motivation to quit using tobacco: wife would like him to quit for his health, he would like to make her happy. He also notes he wants to quit because it is not socially acceptbale . Discussed benefit of pharmacotherapy + cognitive behavioral therapy. Discussed superior benefit of varenicline vs dual nicotine replacement therapy with patch + gum/lozenges. Patient elects to try varenicline. Counseled on administration and to take with food. Discussed with PCP Mable Paris, she is in agreement. Start varenicline 0.5 mg daily x 3 days, then 0.5 mg BID x 4 days, the 1 mg BID thereafter. Discussed setting a quit date within ~ 1 month after reaching steady dose.  Marland Kitchen Discussed [place] Quitline. Information and brochure provided. Provided motivational interviewing regarding behavioral strategies to avoid tobacco use  Patient Goals/Self-Care Activities . Over the next 90 days, patient will:  - take medications as prescribed Collaborate with provider on tobacco cessation  Follow Up Plan: Telephone follow up appointment with care management team member scheduled for:~ 5 weeks      Plan: Telephone follow up  appointment with care management team member scheduled for:~ 4 weeks  Catie Darnelle Maffucci, PharmD, [place], [place] Pharmacist [place] [phone]

## 2019-12-22 NOTE — Progress Notes (Signed)
I have collaborated with the care management provider regarding care management and care coordination activities outlined in this encounter and have reviewed this encounter including documentation in the note and care plan. I am certifying that I agree with the content of this note and encounter as primary care provider.   Mable Paris, NP

## 2020-01-25 ENCOUNTER — Ambulatory Visit: Payer: 59 | Admitting: Pharmacist

## 2020-01-25 DIAGNOSIS — Z720 Tobacco use: Secondary | ICD-10-CM

## 2020-01-25 NOTE — Patient Instructions (Signed)
Visit Information  Patient Care Plan: Medication Management    Problem Identified: Tobacco Use     Long-Range Goal: Disease Progression Prevention   This Visit's Progress: On track  Recent Progress: On track  Priority: High  Note:   Current Barriers:  . Unable to independently cease tobacco use   Pharmacist Clinical Goal(s):  Marland Kitchen Over the next 90 days, patient will  work towards tobacco cessation  through collaboration with PharmD and provider.   Interventions: . 1:1 collaboration with Burnard Hawthorne, FNP regarding development and update of comprehensive plan of care as evidenced by provider attestation and co-signature . Inter-disciplinary care team collaboration (see longitudinal plan of care) . Comprehensive medication review performed; medication list updated in electronic medical record  Tobacco Abuse: . 1 can of dip per day (roughly equivalent to 3-4 packs of cigarettes daily) ~30 years of use. Started varenicline ~01/11/20, as he set his birthday as his quit date. Varenicline 1 mg BID, notes occasional queasiness, but tolerating well.  . Previous quit attempts: successful when he and college friends made a $1000 bet that they would quit smoking for a year. Immediately started again the day after the bet ended. Noted that he has tried patches for ~3-4 weeks and gum before, but never together . Triggers to dip: habit; especially when out with friends and they are using   . Motivation to quit using tobacco: wife would like him to quit for his health, he would like to make her happy. He also notes he wants to quit because it is not socially acceptable . Encouraged to continue plan to quit using dip 02/11/20. Notes that he is going to try to start tapering down on use over the next few weeks. Discussed that he could incorporate nicotine gum to use to help with taper. Patient verbalized understanding  Patient Goals/Self-Care Activities . Over the next 30 days, patient will:  - take  medications as prescribed Collaborate with provider on tobacco cessation - Reduce tobacco use  Follow Up Plan: Telephone follow up appointment with care management team member scheduled for:~ 4 weeks     The patient verbalized understanding of instructions, educational materials, and care plan provided today and declined offer to receive copy of patient instructions, educational materials, and care plan.   Plan: Telephone follow up appointment with care management team member scheduled for:~ 4 weeks  Catie Darnelle Maffucci, PharmD, [place], [place] Clinical Pharmacist [employer] at [employer] [phone]

## 2020-01-25 NOTE — Chronic Care Management (AMB) (Cosign Needed)
Care Management   Pharmacy Note  01/25/2020 Name: Alexander Bishop MRN: [id number] DOB: 07-04-1965  Alexander Bishop is a 55 y.o. year old male who is a primary care patient of Burnard Hawthorne, FNP. The Care Management/Care Coordination team team was consulted for assistance with care management and care coordination needs.    Engaged with patient by telephone for follow up visit in response to provider referral for pharmacy case management and/or care coordination services.   Consent to Services:  Patient was given information about care management/care coordination services, agreed to services, and gave verbal consent prior to initiation of services. Please see initial visit note for detailed documentation.   Review of patient status, including review of consultants reports, laboratory and other test data, was performed as part of comprehensive evaluation and provision of chronic care management services.   SDOH (Social Determinants of Health) assessments and interventions performed:  SDOH Interventions   Flowsheet Row Most Recent Value  SDOH Interventions   SDOH Interventions for the Following Domains Tobacco  Tobacco Interventions Cessation Materials Given and Reviewed       Objective:  Lab Results  Component Value Date   CREATININE 1.15 11/16/2019   CREATININE 1.20 08/01/2018   CREATININE 1.24 10/01/2017    Lab Results  Component Value Date   HGBA1C 5.0 11/16/2019       Component Value Date/Time   CHOL 157 11/16/2019 1525   TRIG 179.0 (H) 11/16/2019 1525   HDL 48.40 11/16/2019 1525   CHOLHDL 3 11/16/2019 1525   VLDL 35.8 11/16/2019 1525   LDLCALC 73 11/16/2019 1525   LDLCALC 77 10/01/2017 0824    Clinical ASCVD: No  - but is a tobacco user, risk calculator below incorporates smoking. Risk is 5.6% if you incorporate his use of tobacco via dip/chew as an equivalent risk The 10-year ASCVD risk score Mikey Bussing DC Brooke Bonito., et al., 2013) is: 2.7%   Values used to  calculate the score:     Age: 79 years     Sex: Male     Is Non-Hispanic African American: No     Diabetic: No     Tobacco smoker: No     Systolic Blood Pressure: 407 mmHg     Is BP treated: No     HDL Cholesterol: 48.4 mg/dL     Total Cholesterol: 157 mg/dL     BP Readings from Last 3 Encounters:  11/16/19 116/82  08/01/18 120/80  08/30/17 124/87    Care Plan  No Known Allergies  Medications Reviewed Today    Reviewed by De Hollingshead, RPH-CPP (Pharmacist) on 01/25/20 at 1148  Med List Status: <None>  Medication Order Taking? Sig Documenting Provider Last Dose Status Informant  cyanocobalamin (,VITAMIN B-12,) 1000 MCG/ML injection [id number]  1000 mcg (1 mL) intramuscular injection in the thigh ( vastus lateralis) once per month. Burnard Hawthorne, FNP  Active   folic acid (FOLVITE) 1 MG tablet [id number]  Take 1 tablet (1 mg total) by mouth daily. Burnard Hawthorne, FNP  Active   SYRINGE-NEEDLE, DISP, 3 ML 25G X 1" 3 ML MISC [id number]  Used to give monthly B-12 injections. Burnard Hawthorne, FNP  Active   tadalafil (CIALIS) 20 MG tablet [id number]  TAKE 1/2 TABLET BY MOUTH EVERY OTHER DAY AS NEEDED FOR ERECTILE DYSFUNCTION. MAX 1 TAB PER 24HRS. MAY LAST 36 HRS. Burnard Hawthorne, FNP  Active   varenicline (CHANTIX) 1 MG tablet [id number] Yes Take 0.5 tablet daily x 3  days, then 0.5 tablets BID x 4 days, then 1 tablet BID thereafter Burnard Hawthorne, FNP Taking Active           Patient Active Problem List   Diagnosis Date Noted  . Finger joint swelling, left 08/01/2018  . Crohn's disease of small intestine with intestinal obstruction ([place])   . B12 deficiency 04/23/2017  . Rosacea 08/08/2014  . Crohn's disease without complication ([place]) 16/10/9602  . Tobacco use 08/08/2014  . Annual physical exam 08/08/2014  . Male erectile dysfunction 08/08/2014    Conditions to be addressed/monitored: tobacco use  Patient Care Plan: Medication Management    Problem  Identified: Tobacco Use     Long-Range Goal: Disease Progression Prevention   This Visit's Progress: On track  Recent Progress: On track  Priority: High  Note:   Current Barriers:  . Unable to independently cease tobacco use   Pharmacist Clinical Goal(s):  Marland Kitchen Over the next 90 days, patient will  work towards tobacco cessation  through collaboration with PharmD and provider.   Interventions: . 1:1 collaboration with Burnard Hawthorne, FNP regarding development and update of comprehensive plan of care as evidenced by provider attestation and co-signature . Inter-disciplinary care team collaboration (see longitudinal plan of care) . Comprehensive medication review performed; medication list updated in electronic medical record  Tobacco Abuse: . 1 can of dip per day (roughly equivalent to 3-4 packs of cigarettes daily) ~30 years of use. Started varenicline ~01/11/20, as he set his birthday as his quit date. Varenicline 1 mg BID, notes occasional queasiness, but tolerating well.  . Previous quit attempts: successful when he and college friends made a $1000 bet that they would quit smoking for a year. Immediately started again the day after the bet ended. Noted that he has tried patches for ~3-4 weeks and gum before, but never together . Triggers to dip: habit; especially when out with friends and they are using   . Motivation to quit using tobacco: wife would like him to quit for his health, he would like to make her happy. He also notes he wants to quit because it is not socially acceptable . Encouraged to continue plan to quit using dip 02/11/20. Notes that he is going to try to start tapering down on use over the next few weeks. Discussed that he could incorporate nicotine gum to use to help with taper. Patient verbalized understanding  Patient Goals/Self-Care Activities . Over the next 30 days, patient will:  - take medications as prescribed Collaborate with provider on tobacco cessation -  Reduce tobacco use  Follow Up Plan: Telephone follow up appointment with care management team member scheduled for:~ 4 weeks     Medication Assistance: None required. Patient affirms current coverage meets needs.   Plan: Telephone follow up appointment with care management team member scheduled for:~ 4 weeks  Catie Darnelle Maffucci, PharmD, [place], CPP Clinical Pharmacist [place] [facility]  I have collaborated with the care management provider regarding care management and care coordination activities outlined in this encounter and have reviewed this encounter including documentation in the note and care plan. I am certifying that I agree with the content of this note and encounter as primary care provider.   Mable Paris, NP

## 2020-02-08 ENCOUNTER — Other Ambulatory Visit: Payer: Self-pay

## 2020-02-13 ENCOUNTER — Other Ambulatory Visit: Payer: Self-pay | Admitting: Family

## 2020-02-13 DIAGNOSIS — N521 Erectile dysfunction due to diseases classified elsewhere: Secondary | ICD-10-CM

## 2020-02-22 ENCOUNTER — Ambulatory Visit: Payer: 59 | Admitting: Pharmacist

## 2020-02-22 DIAGNOSIS — Z87891 Personal history of nicotine dependence: Secondary | ICD-10-CM

## 2020-02-22 NOTE — Patient Instructions (Signed)
Visit Information  Goals Addressed              This Visit's Progress     Patient Stated   .  Tobacco Cessation (pt-stated)        Patient Goals/Self-Care Activities . Over the next 30 days, patient will:  - take medications as prescribed Collaborate with provider on tobacco cessation - Remain tobacco free       Patient verbalizes understanding of instructions provided today and agrees to view in [place].   Plan: Telephone follow up appointment with care management team member scheduled for:  ~ 6 weeks  Catie Darnelle Maffucci, PharmD, [place], [place] Clinical Pharmacist [employer] at [employer] [phone]

## 2020-02-22 NOTE — Chronic Care Management (AMB) (Addendum)
Care Management   Pharmacy Note  02/22/2020 Name: REINHARDT LICAUSI MRN: [id number] DOB: Jul 20, 1965  Subjective: Alexander Bishop is a 55 y.o. year old male who is a primary care patient of Burnard Hawthorne, FNP. The Care Management team was consulted for assistance with care management and care coordination needs.    Engaged with patient by telephone for follow up visit in response to provider referral for pharmacy case management and/or care coordination services.   The patient was given information about Care Management services today including:  1. Care Management services includes personalized support from designated clinical staff supervised by the patient's primary care provider, including individualized plan of care and coordination with other care providers. 2. 24/7 contact phone numbers for assistance for urgent and routine care needs. 3. The patient may stop case management services at any time by phone call to the office staff.  Patient agreed to services and consent obtained.  Assessment:  Review of patient status, including review of consultants reports, laboratory and other test data, was performed as part of comprehensive evaluation and provision of chronic care management services.   SDOH (Social Determinants of Health) assessments and interventions performed:  SDOH Interventions   Flowsheet Row Most Recent Value  SDOH Interventions   SDOH Interventions for the Following Domains Tobacco  Tobacco Interventions Cessation Materials Given and Reviewed       Objective:  Lab Results  Component Value Date   CREATININE 1.15 11/16/2019   CREATININE 1.20 08/01/2018   CREATININE 1.24 10/01/2017    Lab Results  Component Value Date   HGBA1C 5.0 11/16/2019       Component Value Date/Time   CHOL 157 11/16/2019 1525   TRIG 179.0 (H) 11/16/2019 1525   HDL 48.40 11/16/2019 1525   CHOLHDL 3 11/16/2019 1525   VLDL 35.8 11/16/2019 1525   LDLCALC 73 11/16/2019 1525    LDLCALC 77 10/01/2017 0824    BP Readings from Last 3 Encounters:  11/16/19 116/82  08/01/18 120/80  08/30/17 124/87    Care Plan  No Known Allergies  Medications Reviewed Today    Reviewed by De Hollingshead, RPH-CPP (Pharmacist) on 02/22/20 at 1127  Med List Status: <None>  Medication Order Taking? Sig Documenting Provider Last Dose Status Informant  cyanocobalamin (,VITAMIN B-12,) 1000 MCG/ML injection [id number] Yes 1000 mcg (1 mL) intramuscular injection in the thigh ( vastus lateralis) once per month. Burnard Hawthorne, FNP Taking Active   folic acid (FOLVITE) 1 MG tablet [id number] Yes Take 1 tablet (1 mg total) by mouth daily. Burnard Hawthorne, FNP Taking Active   SYRINGE-NEEDLE, DISP, 3 ML 25G X 1" 3 ML MISC [id number] Yes Used to give monthly B-12 injections. Burnard Hawthorne, FNP Taking Active   tadalafil (CIALIS) 20 MG tablet [id number] Yes TAKE 1/2 TABLET BY MOUTH EVERY OTHER DAY AS NEEDED FOR ERECTILE DYSFUNCTION. MAX 1 TAB PER 24HRS. MAY LAST 36 HRS. Burnard Hawthorne, FNP Taking Active   varenicline (CHANTIX) 1 MG tablet [id number] Yes Take 0.5 tablet daily x 3 days, then 0.5 tablets BID x 4 days, then 1 tablet BID thereafter Burnard Hawthorne, FNP Taking Active           Patient Active Problem List   Diagnosis Date Noted  . Finger joint swelling, left 08/01/2018  . Crohn's disease of small intestine with intestinal obstruction ([place])   . B12 deficiency 04/23/2017  . Rosacea 08/08/2014  . Crohn's disease without complication ([place]) 83/09/4074  . Tobacco  use 08/08/2014  . Annual physical exam 08/08/2014  . Male erectile dysfunction 08/08/2014    Conditions to be addressed/monitored: tobacco use  Care Plan : Medication Management  Updates made by De Hollingshead, RPH-CPP since 02/22/2020 12:00 AM    Problem: Tobacco Use     Long-Range Goal: Disease Progression Prevention   This Visit's Progress: On track  Recent Progress: On track  Priority: High   Note:   Current Barriers:  . Unable to independently cease tobacco use   Pharmacist Clinical Goal(s):  Marland Kitchen Over the next 90 days, patient will  work towards tobacco cessation  through collaboration with PharmD and provider.   Interventions: . 1:1 collaboration with Burnard Hawthorne, FNP regarding development and update of comprehensive plan of care as evidenced by provider attestation and co-signature . Inter-disciplinary care team collaboration (see longitudinal plan of care) . Comprehensive medication review performed; medication list updated in electronic medical record  Tobacco Abuse: . Has remained TOBACCO FREE since 02/11/20. Hx:1 can of dip per day (roughly equivalent to 3-4 packs of cigarettes daily) ~30 years of use.  . Current treatment: varenicline 1 mg BID, nicotine gum 4 mg PRN - 2-6 pieces per day. More in the last several days as things ave been slow at work.  . Triggers to dip: meal times. Notes that he anticipates hurdles when he is next hanging out with friends.  . Motivation to quit using tobacco: wife would like him to quit for his health, social acceptability . Praised for quitting tobacco as planned.  . Provided motivational interviewing. Discussed brainstorming strategies to keep hand/mouth occupied after meals. Discussed brainstorming/roleplaying upcoming situations where he will be tempted to use dip. Encouraged to communicate his health goals with friends before gatherings so that they can support him.  . Encouraged continued varenicline treatment for at least another 4-5 months to reduce risk of relapse.   Patient Goals/Self-Care Activities . Over the next 30 days, patient will:  - take medications as prescribed Collaborate with provider on tobacco cessation - Remain tobacco free  Follow Up Plan: Telephone follow up appointment with care management team member scheduled for:~ 6 weeks     Medication Assistance:  None required.  Patient affirms current  coverage meets needs.  Follow Up:  Patient agrees to Care Plan and Follow-up.  Plan: Telephone follow up appointment with care management team member scheduled for:  ~ 6 weeks  Catie Darnelle Maffucci, PharmD, [place], CPP Clinical Pharmacist [place] at [facility] [phone]  I have collaborated with the care management provider regarding care management and care coordination activities outlined in this encounter and have reviewed this encounter including documentation in the note and care plan. I am certifying that I agree with the content of this note and encounter as primary care provider.   Mable Paris, NP

## 2020-03-04 ENCOUNTER — Other Ambulatory Visit: Payer: Self-pay

## 2020-03-04 ENCOUNTER — Encounter: Payer: Self-pay | Admitting: Dermatology

## 2020-03-04 ENCOUNTER — Ambulatory Visit: Payer: 59 | Admitting: Dermatology

## 2020-03-04 ENCOUNTER — Other Ambulatory Visit: Payer: Self-pay | Admitting: Dermatology

## 2020-03-04 DIAGNOSIS — L578 Other skin changes due to chronic exposure to nonionizing radiation: Secondary | ICD-10-CM | POA: Diagnosis not present

## 2020-03-04 DIAGNOSIS — L719 Rosacea, unspecified: Secondary | ICD-10-CM | POA: Diagnosis not present

## 2020-03-04 DIAGNOSIS — B353 Tinea pedis: Secondary | ICD-10-CM | POA: Diagnosis not present

## 2020-03-04 DIAGNOSIS — D1800 Hemangioma unspecified site: Secondary | ICD-10-CM | POA: Diagnosis not present

## 2020-03-04 DIAGNOSIS — D229 Melanocytic nevi, unspecified: Secondary | ICD-10-CM | POA: Diagnosis not present

## 2020-03-04 DIAGNOSIS — L821 Other seborrheic keratosis: Secondary | ICD-10-CM | POA: Diagnosis not present

## 2020-03-04 DIAGNOSIS — L573 Poikiloderma of Civatte: Secondary | ICD-10-CM | POA: Diagnosis not present

## 2020-03-04 DIAGNOSIS — B351 Tinea unguium: Secondary | ICD-10-CM | POA: Diagnosis not present

## 2020-03-04 DIAGNOSIS — L814 Other melanin hyperpigmentation: Secondary | ICD-10-CM

## 2020-03-04 MED ORDER — TERBINAFINE HCL 250 MG PO TABS: 250.0000 mg | ORAL_TABLET | Freq: Every day | ORAL | 0 refills | Status: DC

## 2020-03-04 NOTE — Progress Notes (Signed)
   Follow-Up Visit   Subjective  Alexander Bishop is a 55 y.o. male who presents for the following: Tbse (Patient is here today for tbse. Patient states he has some white areas under chin and would like checked. Patient has history of roscaea ). Patient here for full body skin exam and skin cancer screening.  The following portions of the chart were reviewed this encounter and updated as appropriate:  Tobacco  Allergies  Meds  Problems  Med Hx  Surg Hx  Fam Hx      Objective  Well appearing patient in no apparent distress; mood and affect are within normal limits.  A full examination was performed including scalp, head, eyes, ears, nose, lips, neck, chest, axillae, abdomen, back, buttocks, bilateral upper extremities, bilateral lower extremities, hands, feet, fingers, toes, fingernails, and toenails. All findings within normal limits unless otherwise noted below.  Objective  face: Clear today   Objective  bilateral feet: Scaling and maceration web spaces and over distal and lateral soles.   Assessment & Plan   Lentigines - Scattered tan macules - Discussed due to sun exposure - Benign, observe - Call for any changes  Seborrheic Keratoses Back , chest - Stuck-on, waxy, tan-brown papules and plaques  - Discussed benign etiology and prognosis. - Observe - Call for any changes  Melanocytic Nevi - Tan-brown and/or pink-flesh-colored symmetric macules and papules - Benign appearing on exam today - Observation - Call clinic for new or changing moles - Recommend daily use of broad spectrum spf 30+ sunscreen to sun-exposed areas.   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  - Call for new or changing lesions.  Skin cancer screening performed today.  Rosacea face With recent flare  Now calm after short course of doxycycline Patient states he would like to wait until things flare up, he will call for  Oracea 40 mg if needed  Rosacea is a chronic progressive skin condition usually affecting the face of adults, causing redness and/or acne bumps. It is treatable but not curable. It sometimes affects the eyes (ocular rosacea) as well. It may respond to topical and/or systemic medication and can flare with stress, sun exposure, alcohol, exercise and some foods.  Daily application of broad spectrum spf 30+ sunscreen to face is recommended to reduce flares.  Tinea pedis of both feet Tinea unguium bilateral feet Tinea unguium  - chronic; persistent No history of liver disease/problems.  I reviewed meds and pt says he is only taking one that should not interact. Start oral pill Lamisil 250 mg 1 po qd 30 pills no refill  Follow up in 1 month   Ordered Medications: terbinafine (LAMISIL) 250 MG tablet  Return in about 5 weeks (around 04/08/2020) for follow up on tinea unguium .  IRuthell Rummage, CMA, am acting as scribe for Sarina Ser, MD.  Documentation: I have reviewed the above documentation for accuracy and completeness, and I agree with the above.  Sarina Ser, MD

## 2020-03-04 NOTE — Patient Instructions (Addendum)
Melanoma ABCDEs  Melanoma is the most dangerous type of skin cancer, and is the leading cause of death from skin disease.  You are more likely to develop melanoma if you:  Have light-colored skin, light-colored eyes, or red or blond hair  Spend a lot of time in the sun  Tan regularly, either outdoors or in a tanning bed  Have had blistering sunburns, especially during childhood  Have a close family member who has had a melanoma  Have atypical moles or large birthmarks  Early detection of melanoma is key since treatment is typically straightforward and cure rates are extremely high if we catch it early.   The first sign of melanoma is often a change in a mole or a new dark spot.  The ABCDE system is a way of remembering the signs of melanoma.  A for asymmetry:  The two halves do not match. B for border:  The edges of the growth are irregular. C for color:  A mixture of colors are present instead of an even brown color. D for diameter:  Melanomas are usually (but not always) greater than 5m - the size of a pencil eraser. E for evolution:  The spot keeps changing in size, shape, and color.  Please check your skin once per month between visits. You can use a small mirror in front and a large mirror behind you to keep an eye on the back side or your body.   If you see any new or changing lesions before your next follow-up, please call to schedule a visit.  Please continue daily skin protection including broad spectrum sunscreen SPF 30+ to sun-exposed areas, reapplying every 2 hours as needed when you're outdoors.   Terbinafine Counseling  Terbinafine is an anti-fungal medicine that can be applied to the skin (over the counter) or taken by mouth (prescription) to treat fungal infections. The pill version is often used to treat fungal infections of the nails or scalp. While most people do not have any side effects from taking terbinafine pills, some possible side effects of the medicine  can include taste changes, headache, loss of smell, vision changes, nausea, vomiting, or diarrhea.   Rare side effects can include irritation of the liver, allergic reaction, or decrease in blood counts (which may show up as not feeling well or developing an infection). If you are concerned about any of these side effects, please stop the medicine and call your doctor, or in the case of an emergency such as feeling very unwell, seek immediate medical care.

## 2020-04-03 ENCOUNTER — Ambulatory Visit: Payer: 59 | Admitting: Pharmacist

## 2020-04-03 DIAGNOSIS — Z87891 Personal history of nicotine dependence: Secondary | ICD-10-CM

## 2020-04-03 NOTE — Chronic Care Management (AMB) (Addendum)
Care Management   Pharmacy Note  04/03/2020 Name: Alexander Bishop MRN: [id number] DOB: 1965/06/27  Subjective: Alexander Bishop is a 55 y.o. year old male who is a primary care patient of Burnard Hawthorne, FNP. The Care Management team was consulted for assistance with care management and care coordination needs.    Engaged with patient by telephone for follow up visit in response to provider referral for pharmacy case management and/or care coordination services.   The patient was given information about Care Management services today including:  1. Care Management services includes personalized support from designated clinical staff supervised by the patient's primary care provider, including individualized plan of care and coordination with other care providers. 2. 24/7 contact phone numbers for assistance for urgent and routine care needs. 3. The patient may stop case management services at any time by phone call to the office staff.  Patient agreed to services and consent obtained.  Assessment:  Review of patient status, including review of consultants reports, laboratory and other test data, was performed as part of comprehensive evaluation and provision of chronic care management services.   SDOH (Social Determinants of Health) assessments and interventions performed:  SDOH Interventions   Flowsheet Row Most Recent Value  SDOH Interventions   SDOH Interventions for the Following Domains Tobacco  Tobacco Interventions Cessation Materials Given and Reviewed       Objective:  Lab Results  Component Value Date   CREATININE 1.15 11/16/2019   CREATININE 1.20 08/01/2018   CREATININE 1.24 10/01/2017    Lab Results  Component Value Date   HGBA1C 5.0 11/16/2019       Component Value Date/Time   CHOL 157 11/16/2019 1525   TRIG 179.0 (H) 11/16/2019 1525   HDL 48.40 11/16/2019 1525   CHOLHDL 3 11/16/2019 1525   VLDL 35.8 11/16/2019 1525   LDLCALC 73 11/16/2019 1525    LDLCALC 77 10/01/2017 0824     Clinical ASCVD: No  The 10-year ASCVD risk score Mikey Bussing DC Jr., et al., 2013) is: 3%   Values used to calculate the score:     Age: 37 years     Sex: Male     Is Non-Hispanic African American: No     Diabetic: No     Tobacco smoker: No     Systolic Blood Pressure: 160 mmHg     Is BP treated: No     HDL Cholesterol: 48.4 mg/dL     Total Cholesterol: 157 mg/dL     BP Readings from Last 3 Encounters:  11/16/19 116/82  08/01/18 120/80  08/30/17 124/87    Care Plan  No Known Allergies  Medications Reviewed Today    Reviewed by Ralene Bathe, MD (Physician) on 03/04/20 at [place] List Status: <None>  Medication Order Taking? Sig Documenting Provider Last Dose Status Informant  cyanocobalamin (,VITAMIN B-12,) 1000 MCG/ML injection [id number] Yes 1000 mcg (1 mL) intramuscular injection in the thigh ( vastus lateralis) once per month. Burnard Hawthorne, FNP Taking Active   folic acid (FOLVITE) 1 MG tablet [id number] Yes Take 1 tablet (1 mg total) by mouth daily. Burnard Hawthorne, FNP Taking Active   nicotine polacrilex (NICORETTE) 4 MG gum [id number] Yes Take 4 mg by mouth as needed for smoking cessation. [provider] Taking Active   SYRINGE-NEEDLE, DISP, 3 ML 25G X 1" 3 ML MISC [id number] Yes Used to give monthly B-12 injections. Burnard Hawthorne, FNP Taking Active   tadalafil (CIALIS) 20 MG  tablet [id number] Yes TAKE 1/2 TABLET BY MOUTH EVERY OTHER DAY AS NEEDED FOR ERECTILE DYSFUNCTION. MAX 1 TAB PER 24HRS. MAY LAST 36 HRS. Burnard Hawthorne, FNP Taking Active   terbinafine (LAMISIL) 250 MG tablet [id number] Yes Take 1 tablet (250 mg total) by mouth daily. Ralene Bathe, MD  Active   varenicline (CHANTIX) 1 MG tablet [id number] Yes Take 0.5 tablet daily x 3 days, then 0.5 tablets BID x 4 days, then 1 tablet BID thereafter Burnard Hawthorne, FNP Taking Active           Patient Active Problem List   Diagnosis Date Noted  .  Finger joint swelling, left 08/01/2018  . Crohn's disease of small intestine with intestinal obstruction ([place])   . B12 deficiency 04/23/2017  . Rosacea 08/08/2014  . Crohn's disease without complication ([place]) 01/41/0301  . Tobacco use 08/08/2014  . Annual physical exam 08/08/2014  . Male erectile dysfunction 08/08/2014    Conditions to be addressed/monitored: Tobacco use  Care Plan : Medication Management  Updates made by De Hollingshead, RPH-CPP since 04/03/2020 12:00 AM  Completed 04/03/2020  Problem: Tobacco Use Resolved 04/03/2020    Long-Range Goal: Disease Progression Prevention Completed 04/03/2020  This Visit's Progress: On track  Recent Progress: On track  Priority: High  Note:   Current Barriers:  . Unable to independently cease tobacco use   Pharmacist Clinical Goal(s):  Marland Kitchen Over the next 90 days, patient will  work towards tobacco cessation  through collaboration with PharmD and provider.   Interventions: . 1:1 collaboration with Burnard Hawthorne, FNP regarding development and update of comprehensive plan of care as evidenced by provider attestation and co-signature . Inter-disciplinary care team collaboration (see longitudinal plan of care) . Comprehensive medication review performed; medication list updated in electronic medical record  Tobacco Abuse: . Has remained TOBACCO FREE since 02/11/20. Hx:1 can of dip per day (roughly equivalent to 3-4 packs of cigarettes daily) ~30 years of use.  . Current treatment: varenicline 1 mg BID - discontinued about 2 weeks ago, notes very vivid dreams; Nicotine gum 2 mg PRN - using about 8 pieces daily. Reduced from 4 mg gum. Martin Majestic on sales trip, then a trip with friends, both with people who also dip. Was able to avoid using dip in these situations.  . Discussed that ideally we recommend treatment for 3-6 months post quitting tobacco to increase rate of prolonged abstinence. Patient feels he is able to manage cravings at this  time, and would like to stay off varenicline. This is appropriate. Encouraged to restart varenicline if he feels he is at risk of restarting dip. Re-educated on titration starter dose.   . Praised for continued cessation. Encouraged continuation.   Patient Goals/Self-Care Activities . Over the next 30 days, patient will:  - take medications as prescribed Collaborate with provider on tobacco cessation - Remain tobacco free  Follow Up Plan: Offered follow up. Patient declines. Notes he will outreach in the future if needs arise.      Medication Assistance:  None required.  Patient affirms current coverage meets needs.  Follow Up:  Patient requests no follow-up at this time.  Plan: Offered follow up. Patient declines. Notes he will outreach in the future if needs arise.  Catie Darnelle Maffucci, PharmD, [place], CPP Clinical Pharmacist [place] at [facility] [phone]  Encounter details: CCM Time Spent      Value Time User   Total time (minutes)  0 04/03/2020 11:37 AM Darnelle Maffucci,  Arville Lime, RPH-CPP     Moderate to High Complex Decision Making    None     CCM Services: This encounter meets routine CCM services.  Prior to outreach and patient consent for Chronic Care Management, I referred this patient for services after reviewing the nominated patient list or from a personal encounter with the patient.  I have personally reviewed this encounter including the documentation in this note and have collaborated with the care management provider regarding care management and care coordination activities to include development and update of the comprehensive care plan. I am certifying that I agree with the content of this note and encounter as supervising physician. Mable Paris, NP

## 2020-04-03 NOTE — Patient Instructions (Signed)
Visit Information  Goals Addressed              This Visit's Progress     Patient Stated   .  COMPLETED: Tobacco Cessation (pt-stated)        Patient Goals/Self-Care Activities . Over the next 30 days, patient will:  - take medications as prescribed Collaborate with provider on tobacco cessation - Remain tobacco free       Patient verbalizes understanding of instructions provided today and agrees to view in [place].   Plan: Offered follow up. Patient declines. Notes he will outreach in the future if needs arise.  Catie Darnelle Maffucci, PharmD, [place], [place] Clinical Pharmacist [employer] at [place]

## 2020-04-11 ENCOUNTER — Other Ambulatory Visit: Payer: Self-pay | Admitting: Dermatology

## 2020-04-11 ENCOUNTER — Ambulatory Visit: Payer: 59 | Admitting: Dermatology

## 2020-04-11 ENCOUNTER — Other Ambulatory Visit: Payer: Self-pay

## 2020-04-11 DIAGNOSIS — B351 Tinea unguium: Secondary | ICD-10-CM

## 2020-04-11 MED ORDER — TERBINAFINE HCL 250 MG PO TABS: 250.0000 mg | ORAL_TABLET | Freq: Every day | ORAL | 2 refills | Status: DC

## 2020-04-11 NOTE — Progress Notes (Signed)
   Follow-Up Visit   Subjective  Alexander Bishop is a 55 y.o. male who presents for the following: Follow-up (5 weeks f/u Tinea unguium on the toenails, pt finished Terbinafine 250 mg tablets  1 day ago, no side effects from Terbinafine tablets ).  The following portions of the chart were reviewed this encounter and updated as appropriate:   Tobacco  Allergies  Meds  Problems  Med Hx  Surg Hx  Fam Hx     Review of Systems:  No other skin or systemic complaints except as noted in HPI or Assessment and Plan.  Objective  Well appearing patient in no apparent distress; mood and affect are within normal limits.  A focused examination was performed including face, feet, toenails . Relevant physical exam findings are noted in the Assessment and Plan.  Objective  feet: Scaling and maceration web spaces and over distal and lateral soles   Assessment & Plan  Tinea unguium and pedis - improving on oral Lamisil feet Chronic; persistent. Tinea unguium  - chronic; persistent-improving on Terbinafine  No history of liver disease/problems.  Continue oral pill Terbinafine 250 mg 1 po qd 30 pills 2 refill for 90 days then stop,   Reordered Medications terbinafine (LAMISIL) 250 MG tablet  Return in about 6 months (around 10/12/2020) for TBSE, Tinea .  IMarye Round, CMA, am acting as scribe for Sarina Ser, MD .  Documentation: I have reviewed the above documentation for accuracy and completeness, and I agree with the above.  Sarina Ser, MD

## 2020-04-11 NOTE — Patient Instructions (Addendum)
.  ter

## 2020-04-16 ENCOUNTER — Encounter: Payer: Self-pay | Admitting: Dermatology

## 2020-04-19 ENCOUNTER — Other Ambulatory Visit: Payer: Self-pay

## 2020-05-03 ENCOUNTER — Other Ambulatory Visit: Payer: Self-pay

## 2020-05-03 MED FILL — Tadalafil Tab 20 MG: ORAL | 30 days supply | Qty: 6 | Fill #0 | Status: AC

## 2020-05-13 ENCOUNTER — Other Ambulatory Visit: Payer: Self-pay

## 2020-05-13 MED FILL — Terbinafine HCl Tab 250 MG: ORAL | 30 days supply | Qty: 30 | Fill #0 | Status: AC

## 2020-05-23 ENCOUNTER — Other Ambulatory Visit: Payer: Self-pay

## 2020-05-23 MED ORDER — NICOTINE POLACRILEX 2 MG MT GUM
CHEWING_GUM | OROMUCOSAL | 0 refills | Status: DC
  Filled 2020-05-23: qty 110, 20d supply, fill #0

## 2020-06-10 MED FILL — Tadalafil Tab 20 MG: ORAL | 30 days supply | Qty: 6 | Fill #1 | Status: AC

## 2020-06-11 ENCOUNTER — Other Ambulatory Visit: Payer: Self-pay

## 2020-06-21 ENCOUNTER — Other Ambulatory Visit: Payer: Self-pay

## 2020-06-21 MED ORDER — NICOTINE POLACRILEX 2 MG MT GUM
CHEWING_GUM | OROMUCOSAL | 0 refills | Status: AC
  Filled 2020-06-21: qty 110, 20d supply, fill #0

## 2020-06-24 ENCOUNTER — Other Ambulatory Visit: Payer: Self-pay

## 2020-07-26 ENCOUNTER — Other Ambulatory Visit: Payer: Self-pay

## 2020-07-26 MED FILL — Tadalafil Tab 20 MG: ORAL | 30 days supply | Qty: 6 | Fill #2 | Status: AC

## 2020-09-20 ENCOUNTER — Other Ambulatory Visit: Payer: Self-pay

## 2020-09-20 MED FILL — Tadalafil Tab 20 MG: ORAL | 30 days supply | Qty: 6 | Fill #3 | Status: AC

## 2020-09-30 ENCOUNTER — Other Ambulatory Visit: Payer: Self-pay

## 2020-09-30 MED ORDER — NICOTINE POLACRILEX 4 MG MT GUM
4.0000 mg | CHEWING_GUM | OROMUCOSAL | 0 refills | Status: DC
  Filled 2020-09-30: qty 110, 18d supply, fill #0

## 2020-10-10 ENCOUNTER — Other Ambulatory Visit: Payer: Self-pay

## 2020-10-10 ENCOUNTER — Ambulatory Visit: Payer: 59 | Admitting: Dermatology

## 2020-10-10 DIAGNOSIS — D1800 Hemangioma unspecified site: Secondary | ICD-10-CM

## 2020-10-10 DIAGNOSIS — B351 Tinea unguium: Secondary | ICD-10-CM

## 2020-10-10 DIAGNOSIS — Z1283 Encounter for screening for malignant neoplasm of skin: Secondary | ICD-10-CM | POA: Diagnosis not present

## 2020-10-10 DIAGNOSIS — L821 Other seborrheic keratosis: Secondary | ICD-10-CM

## 2020-10-10 DIAGNOSIS — L719 Rosacea, unspecified: Secondary | ICD-10-CM

## 2020-10-10 DIAGNOSIS — B360 Pityriasis versicolor: Secondary | ICD-10-CM | POA: Diagnosis not present

## 2020-10-10 DIAGNOSIS — D229 Melanocytic nevi, unspecified: Secondary | ICD-10-CM

## 2020-10-10 DIAGNOSIS — Z86018 Personal history of other benign neoplasm: Secondary | ICD-10-CM | POA: Diagnosis not present

## 2020-10-10 DIAGNOSIS — L570 Actinic keratosis: Secondary | ICD-10-CM | POA: Diagnosis not present

## 2020-10-10 DIAGNOSIS — L718 Other rosacea: Secondary | ICD-10-CM | POA: Diagnosis not present

## 2020-10-10 DIAGNOSIS — L578 Other skin changes due to chronic exposure to nonionizing radiation: Secondary | ICD-10-CM | POA: Diagnosis not present

## 2020-10-10 DIAGNOSIS — L814 Other melanin hyperpigmentation: Secondary | ICD-10-CM

## 2020-10-10 MED ORDER — KETOCONAZOLE 2 % EX SHAM
1.0000 "application " | MEDICATED_SHAMPOO | CUTANEOUS | 4 refills | Status: DC
  Filled 2020-10-10: qty 120, 84d supply, fill #0

## 2020-10-10 MED ORDER — FLUCONAZOLE 200 MG PO TABS
200.0000 mg | ORAL_TABLET | ORAL | 0 refills | Status: DC
  Filled 2020-10-10: qty 6, 14d supply, fill #0

## 2020-10-10 MED ORDER — DOXYCYCLINE 40 MG PO CPDR
40.0000 mg | DELAYED_RELEASE_CAPSULE | Freq: Every day | ORAL | 11 refills | Status: DC
  Filled 2020-10-10: qty 30, 30d supply, fill #0

## 2020-10-10 NOTE — Progress Notes (Signed)
Follow-Up Visit   Subjective  Alexander Bishop is a 55 y.o. male who presents for the following: Total body skin exam (Hx of Dysplastic, ) and Rosacea (Face, not taking rosacea now, needs refills). The patient presents for Total-Body Skin Exam (TBSE) for skin cancer screening and mole check.  The following portions of the chart were reviewed this encounter and updated as appropriate:   Tobacco  Allergies  Meds  Problems  Med Hx  Surg Hx  Fam Hx     Review of Systems:  No other skin or systemic complaints except as noted in HPI or Assessment and Plan.  Objective  Well appearing patient in no apparent distress; mood and affect are within normal limits.  A full examination was performed including scalp, head, eyes, ears, nose, lips, neck, chest, axillae, abdomen, back, buttocks, bilateral upper extremities, bilateral lower extremities, hands, feet, fingers, toes, fingernails, and toenails. All findings within normal limits unless otherwise noted below.  L med inf scapular area Scar with no evidence of recurrence.   Head - Anterior (Face) Erythema face and eyes  trunk Hyperpigmented patches with scale, trunk  bil great toenails Dystrophy of bil great toenails  Scalp x 1 Pink scaly macules   Assessment & Plan   Lentigines - Scattered tan macules - Due to sun exposure - Benign-appearing, observe - Recommend daily broad spectrum sunscreen SPF 30+ to sun-exposed areas, reapply every 2 hours as needed. - Call for any changes  Seborrheic Keratoses - Stuck-on, waxy, tan-brown papules and/or plaques  - Benign-appearing - Discussed benign etiology and prognosis. - Observe - Call for any changes  Melanocytic Nevi - Tan-brown and/or pink-flesh-colored symmetric macules and papules - Benign appearing on exam today - Observation - Call clinic for new or changing moles - Recommend daily use of broad spectrum spf 30+ sunscreen to sun-exposed areas.   Hemangiomas -  Red papules - Discussed benign nature - Observe - Call for any changes  Actinic Damage - Chronic condition, secondary to cumulative UV/sun exposure - diffuse scaly erythematous macules with underlying dyspigmentation - Recommend daily broad spectrum sunscreen SPF 30+ to sun-exposed areas, reapply every 2 hours as needed.  - Staying in the shade or wearing long sleeves, sun glasses (UVA+UVB protection) and wide brim hats (4-inch brim around the entire circumference of the hat) are also recommended for sun protection.  - Call for new or changing lesions.  Skin cancer screening performed today.  History of dysplastic nevus L med inf scapular area Clear. Observe for recurrence. Call clinic for new or changing lesions.  Recommend regular skin exams, daily broad-spectrum spf 30+ sunscreen use, and photoprotection.    Rosacea with Ocular Rosacea Head - Anterior (Face) With Ocular Rosacea  Rosacea is a chronic progressive skin condition usually affecting the face of adults, causing redness and/or acne bumps. It is treatable but not curable. It sometimes affects the eyes (ocular rosacea) as well. It may respond to topical and/or systemic medication and can flare with stress, sun exposure, alcohol, exercise and some foods.  Daily application of broad spectrum spf 30+ sunscreen to face is recommended to reduce flares.  Restart Oracea 28m 1 po qpm with dinner  doxycycline (ORACEA) 40 MG capsule - Head - Anterior (Face) Take 1 capsule (40 mg total) by mouth daily. Take with dinner  Related Medications doxycycline (VIBRA-TABS) 100 MG tablet TAKE 1 TABLET BY MOUTH TWICE DAILY  Tinea versicolor - severe and generalized trunk Tinea versicolor is a chronic recurrent skin  rash causing discolored scaly spots most commonly seen on back, chest, and/or shoulders.  It is generally asymptomatic. The rash is due to overgrowth of a common type of yeast present on everyone's skin and it is not contagious.   It tends to flare more in the summer due to increased sweating on trunk.  After rash is treated, the scaliness will resolve, but the discoloration will take longer to return to normal pigmentation. The periodic use of an OTC medicated soap/shampoo with zinc or selenium sulfide can be helpful to prevent yeast overgrowth and recurrence.  Start Fluconazole 245m 1 po 3x/wk for 2 weeks, Monday, Wednesday and Friday  Start Ketoconazole 2% shampoo 2x/wk to trunk, let sit 5 minutes and rinsed out  fluconazole (DIFLUCAN) 200 MG tablet - trunk Take 1 tablet (200 mg total) by mouth 3 (three) times a week. Take 1 po 3 times weekly Monday, Wednesday and Friday for 2 weeks  ketoconazole (NIZORAL) 2 % shampoo - trunk Apply 1 application topically 2 (two) times a week. Apply to trunk let sit for 5 minutes and rinse off, do this 2 times weekly  Tinea unguium bil great toenails Chronic and persistent Finished 3 month course of Lamisil - with improvement May consider topical nail treatment in future  Related Medications terbinafine (LAMISIL) 250 MG tablet TAKE 1 TABLET (250 MG TOTAL) BY MOUTH DAILY.  AK (actinic keratosis) Scalp x 1 Destruction of lesion - Scalp x 1 Complexity: simple   Destruction method: cryotherapy   Informed consent: discussed and consent obtained   Timeout:  patient name, date of birth, surgical site, and procedure verified Lesion destroyed using liquid nitrogen: Yes   Region frozen until ice ball extended beyond lesion: Yes   Outcome: patient tolerated procedure well with no complications   Post-procedure details: wound care instructions given    Return in about 6 months (around 04/09/2021) for Rosacea f/u, TV f/u, AK f/u, Tinea Ungium f/u.  I, SOthelia Pulling RMA, am acting as scribe for DSarina Ser MD . Documentation: I have reviewed the above documentation for accuracy and completeness, and I agree with the above.  DSarina Ser MD

## 2020-10-10 NOTE — Patient Instructions (Signed)

## 2020-10-11 ENCOUNTER — Other Ambulatory Visit: Payer: Self-pay

## 2020-10-11 DIAGNOSIS — L719 Rosacea, unspecified: Secondary | ICD-10-CM

## 2020-10-11 MED ORDER — DOXYCYCLINE HYCLATE 20 MG PO TABS
20.0000 mg | ORAL_TABLET | Freq: Two times a day (BID) | ORAL | 3 refills | Status: DC
  Filled 2020-10-11: qty 60, 30d supply, fill #0
  Filled 2020-12-16: qty 60, 30d supply, fill #1
  Filled 2021-01-28: qty 60, 30d supply, fill #2
  Filled 2021-03-12: qty 60, 30d supply, fill #3

## 2020-10-11 NOTE — Progress Notes (Signed)
Doxycycline 40 mg po QD not covered by insurance. Alternative sent.

## 2020-10-15 ENCOUNTER — Encounter: Payer: Self-pay | Admitting: Dermatology

## 2020-11-05 ENCOUNTER — Other Ambulatory Visit: Payer: Self-pay

## 2020-11-05 MED FILL — Tadalafil Tab 20 MG: ORAL | 30 days supply | Qty: 6 | Fill #4 | Status: AC

## 2020-12-16 ENCOUNTER — Other Ambulatory Visit: Payer: Self-pay | Admitting: Family

## 2020-12-16 ENCOUNTER — Other Ambulatory Visit: Payer: Self-pay

## 2020-12-16 DIAGNOSIS — N521 Erectile dysfunction due to diseases classified elsewhere: Secondary | ICD-10-CM

## 2020-12-17 ENCOUNTER — Other Ambulatory Visit: Payer: Self-pay

## 2020-12-17 MED FILL — Tadalafil Tab 20 MG: ORAL | 30 days supply | Qty: 6 | Fill #0 | Status: AC

## 2021-01-07 DIAGNOSIS — H524 Presbyopia: Secondary | ICD-10-CM | POA: Diagnosis not present

## 2021-01-07 DIAGNOSIS — H5213 Myopia, bilateral: Secondary | ICD-10-CM | POA: Diagnosis not present

## 2021-01-28 ENCOUNTER — Other Ambulatory Visit: Payer: Self-pay

## 2021-01-28 MED FILL — Tadalafil Tab 20 MG: ORAL | 30 days supply | Qty: 6 | Fill #1 | Status: AC

## 2021-03-13 ENCOUNTER — Other Ambulatory Visit: Payer: Self-pay

## 2021-03-28 ENCOUNTER — Other Ambulatory Visit: Payer: Self-pay

## 2021-03-28 DIAGNOSIS — H18822 Corneal disorder due to contact lens, left eye: Secondary | ICD-10-CM | POA: Diagnosis not present

## 2021-03-28 MED FILL — Tadalafil Tab 20 MG: ORAL | 30 days supply | Qty: 6 | Fill #2 | Status: AC

## 2021-04-14 ENCOUNTER — Other Ambulatory Visit: Payer: Self-pay

## 2021-04-14 ENCOUNTER — Ambulatory Visit: Payer: 59 | Admitting: Dermatology

## 2021-04-14 DIAGNOSIS — L718 Other rosacea: Secondary | ICD-10-CM | POA: Diagnosis not present

## 2021-04-14 DIAGNOSIS — L719 Rosacea, unspecified: Secondary | ICD-10-CM | POA: Diagnosis not present

## 2021-04-14 DIAGNOSIS — B360 Pityriasis versicolor: Secondary | ICD-10-CM

## 2021-04-14 DIAGNOSIS — L649 Androgenic alopecia, unspecified: Secondary | ICD-10-CM | POA: Diagnosis not present

## 2021-04-14 DIAGNOSIS — L659 Nonscarring hair loss, unspecified: Secondary | ICD-10-CM

## 2021-04-14 DIAGNOSIS — L578 Other skin changes due to chronic exposure to nonionizing radiation: Secondary | ICD-10-CM | POA: Diagnosis not present

## 2021-04-14 MED ORDER — MINOXIDIL 2.5 MG PO TABS
2.5000 mg | ORAL_TABLET | Freq: Every day | ORAL | 3 refills | Status: DC
  Filled 2021-04-14: qty 30, 30d supply, fill #0
  Filled 2021-05-09: qty 30, 30d supply, fill #1
  Filled 2021-06-10: qty 30, 30d supply, fill #2
  Filled 2021-07-09: qty 30, 30d supply, fill #3

## 2021-04-14 MED ORDER — DOXYCYCLINE HYCLATE 20 MG PO TABS
20.0000 mg | ORAL_TABLET | Freq: Two times a day (BID) | ORAL | 3 refills | Status: AC
  Filled 2021-04-14: qty 60, 30d supply, fill #0

## 2021-04-14 NOTE — Patient Instructions (Signed)

## 2021-04-14 NOTE — Progress Notes (Signed)
? ?Follow-Up Visit ?  ?Subjective  ?Alexander Bishop is a 55 y.o. male who presents for the following: Actinic Keratosis (6 month follow up - LN2 x 1 to scalp), Rosacea (Took Oracea for about 3 months and it did not completely clear so he stopped), and Follow-up (Tinea Versicolor - a little bit better - ketoconazole 2% shampoo, fluconazole 200 mg 1 po 3 times per week for 2 weeks). ?The patient has spots, moles and lesions to be evaluated, some may be new or changing and the patient has concerns that these could be cancer. ? ?The following portions of the chart were reviewed this encounter and updated as appropriate:  ? Tobacco  Allergies  Meds  Problems  Med Hx  Surg Hx  Fam Hx   ?  ?Review of Systems:  No other skin or systemic complaints except as noted in HPI or Assessment and Plan. ? ?Objective  ?Well appearing patient in no apparent distress; mood and affect are within normal limits. ? ?A focused examination was performed including scalp, face, back. Relevant physical exam findings are noted in the Assessment and Plan. ? ?Head - Anterior (Face) ?Erythema and papules ? ? ? ? ?Scalp ? ? ? ? ? ? ? ?Assessment & Plan  ?Rosacea ?With Ocular Rosacea ?Head - Anterior (Face) ? ?Rosacea is a chronic progressive skin condition usually affecting the face of adults, causing redness and/or acne bumps. It is treatable but not curable. It sometimes affects the eyes (ocular rosacea) as well. It may respond to topical and/or systemic medication and can flare with stress, sun exposure, alcohol, exercise and some foods.  Daily application of broad spectrum spf 30+ sunscreen to face is recommended to reduce flares. Pamphlet given today. ? ?Restart Oracea 40 mg 1 po qd with food and plenty of fluid ?Or ?Doxycycline 20 mg 1 p.o. twice daily with food ?Advised doxycycline may make more sensitive the sun ? ?Will prescribe [facility] metronidazole/ivermectin/azelaic acid twice daily as needed to affected areas on the  face. The patient was advised this is not covered by insurance since it is made by a compounding pharmacy. They will receive an email to check out and the medication will be mailed to their home.   ? ?doxycycline (PERIOSTAT) 20 MG tablet - Head - Anterior (Face) ?Take 1 tablet (20 mg total) by mouth 2 (two) times daily. ? 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 ?Start Minoxidil 2.5 mg 1 po qd. Discussed risk of ankle swelling or unwanted hair growth. May add Propecia in the future. ? ? Androgenetic Alopecia (or Male pattern hair loss) refers to the common patterned hair loss affecting many men.  Male pattern alopecia is mediated by dihydrotestosterone which induces miniaturization of androgen-sensitive hair follicles.  It is chronic and persistent, but treatable; not curable. ?Topical treatment includes: ?- 5% topical Minoxidil ?Oral treatment includes: ?- Finasteride 1 mg qd ?- Minoxidil 1.25 - 5 mg qd ?- Dutasteride 0.5 mg qd ?Adjunct therapy includes: ?- Low Level Laser Light Therapy (LLLT) ?- Platelet-rich Plasma injections (PRP) ?- Hair Transplantation or scalp reduction ? ?Related Medications ?minoxidil (LONITEN) 2.5 MG tablet ?Take 1 tablet (2.5 mg total) by mouth daily. ? ?Tinea versicolor ?Trunk ?Restart Ketoconazole 2% shampoo 3 times per week - let sit a few minutes then rinse ? ?ketoconazole (NIZORAL) 2 % shampoo ?Apply 1 application topically 2 (two) times a week. Apply to trunk let sit for 5 minutes and rinse off, do this 2 times weekly ? ?  Actinic Damage -no evidence of any precancerous actinic keratosis today. ?- chronic, secondary to cumulative UV radiation exposure/sun exposure over time ?- diffuse scaly erythematous macules with underlying dyspigmentation ?- Recommend daily broad spectrum sunscreen SPF 30+ to sun-exposed areas, reapply every 2 hours as needed.  ?- Recommend staying in the shade or wearing long sleeves, sun glasses (UVA+UVB protection) and wide brim hats (4-inch brim  around the entire circumference of the hat). ?- Call for new or changing lesions. ? ?Return in about 3 months (around 07/15/2021). ? ?I, Ashok Cordia, CMA, am acting as scribe for Sarina Ser, MD . ?Documentation: I have reviewed the above documentation for accuracy and completeness, and I agree with the above. ? ?Sarina Ser, MD ? ?

## 2021-04-15 ENCOUNTER — Encounter: Payer: Self-pay | Admitting: Dermatology

## 2021-05-09 ENCOUNTER — Other Ambulatory Visit: Payer: Self-pay

## 2021-05-09 MED FILL — Tadalafil Tab 20 MG: ORAL | 30 days supply | Qty: 6 | Fill #3 | Status: AC

## 2021-06-10 ENCOUNTER — Other Ambulatory Visit: Payer: Self-pay | Admitting: Dermatology

## 2021-06-10 ENCOUNTER — Other Ambulatory Visit: Payer: Self-pay

## 2021-06-10 DIAGNOSIS — L719 Rosacea, unspecified: Secondary | ICD-10-CM

## 2021-06-10 MED ORDER — DOXYCYCLINE HYCLATE 20 MG PO TABS
20.0000 mg | ORAL_TABLET | Freq: Two times a day (BID) | ORAL | 3 refills | Status: DC
  Filled 2021-06-10: qty 60, 30d supply, fill #0
  Filled 2021-07-09: qty 60, 30d supply, fill #1

## 2021-06-10 MED FILL — Tadalafil Tab 20 MG: ORAL | 30 days supply | Qty: 6 | Fill #4 | Status: AC

## 2021-06-24 ENCOUNTER — Other Ambulatory Visit: Payer: Self-pay

## 2021-06-24 MED ORDER — ATOVAQUONE-PROGUANIL HCL 250-100 MG PO TABS
ORAL_TABLET | ORAL | 0 refills | Status: DC
  Filled 2021-06-24: qty 16, 16d supply, fill #0

## 2021-06-24 MED ORDER — AZITHROMYCIN 500 MG PO TABS
ORAL_TABLET | ORAL | 0 refills | Status: DC
  Filled 2021-06-24: qty 4, 4d supply, fill #0

## 2021-06-25 ENCOUNTER — Other Ambulatory Visit: Payer: Self-pay

## 2021-07-09 ENCOUNTER — Other Ambulatory Visit: Payer: Self-pay

## 2021-07-09 MED FILL — Tadalafil Tab 20 MG: ORAL | 30 days supply | Qty: 6 | Fill #5 | Status: AC

## 2021-07-16 ENCOUNTER — Telehealth: Payer: Self-pay | Admitting: Family

## 2021-07-16 ENCOUNTER — Encounter: Payer: Self-pay | Admitting: Family

## 2021-07-16 NOTE — Telephone Encounter (Signed)
Pt called sating he would like to be put back on Wellbutrin. Pt stated he had a relapse. Pt request to be called

## 2021-07-16 NOTE — Telephone Encounter (Signed)
SpoKe to patient and got him scheduled for a MCVV  for 7/17 to discuss possibly going back on Wellbutrin

## 2021-07-16 NOTE — Telephone Encounter (Signed)
Call pt I havent seen him since 2021 He was working with catie Please ask him to make an appt Can be VV is easier for him

## 2021-08-04 ENCOUNTER — Other Ambulatory Visit: Payer: Self-pay

## 2021-08-04 ENCOUNTER — Encounter: Payer: Self-pay | Admitting: Family

## 2021-08-04 ENCOUNTER — Telehealth (INDEPENDENT_AMBULATORY_CARE_PROVIDER_SITE_OTHER): Payer: 59 | Admitting: Family

## 2021-08-04 ENCOUNTER — Ambulatory Visit: Payer: 59 | Admitting: Dermatology

## 2021-08-04 VITALS — Ht 67.9 in | Wt 150.0 lb

## 2021-08-04 DIAGNOSIS — E538 Deficiency of other specified B group vitamins: Secondary | ICD-10-CM

## 2021-08-04 DIAGNOSIS — Z720 Tobacco use: Secondary | ICD-10-CM | POA: Diagnosis not present

## 2021-08-04 DIAGNOSIS — N521 Erectile dysfunction due to diseases classified elsewhere: Secondary | ICD-10-CM | POA: Diagnosis not present

## 2021-08-04 MED ORDER — TADALAFIL 20 MG PO TABS
ORAL_TABLET | ORAL | 5 refills | Status: DC
  Filled 2021-08-04: qty 6, 20d supply, fill #0
  Filled 2021-10-07: qty 6, 20d supply, fill #1
  Filled 2021-11-24: qty 6, 20d supply, fill #2
  Filled 2022-02-06: qty 6, 20d supply, fill #3
  Filled 2022-04-09: qty 6, 20d supply, fill #4
  Filled 2022-05-06: qty 6, 20d supply, fill #5

## 2021-08-04 MED ORDER — NICOTINE POLACRILEX 4 MG MT GUM
4.0000 mg | CHEWING_GUM | OROMUCOSAL | 5 refills | Status: AC
  Filled 2021-08-04: qty 110, 18d supply, fill #0
  Filled 2021-09-26: qty 110, 18d supply, fill #1
  Filled 2021-11-25: qty 110, 18d supply, fill #2

## 2021-08-04 MED ORDER — VARENICLINE TARTRATE 1 MG PO TABS
ORAL_TABLET | ORAL | 2 refills | Status: DC
  Filled 2021-08-04: qty 56, 28d supply, fill #0
  Filled 2021-09-05: qty 56, 28d supply, fill #1
  Filled 2021-10-07: qty 56, 28d supply, fill #2

## 2021-08-04 MED ORDER — CYANOCOBALAMIN 1000 MCG/ML IJ SOLN
INTRAMUSCULAR | 0 refills | Status: DC
  Filled 2021-08-04: qty 3, 90d supply, fill #0

## 2021-08-04 MED ORDER — TADALAFIL 20 MG PO TABS
ORAL_TABLET | ORAL | 5 refills | Status: DC
  Filled 2021-08-04: qty 6, 30d supply, fill #0

## 2021-08-04 NOTE — Assessment & Plan Note (Signed)
Patient using smokeless tobacco, dipping.  Restart Chantix, nicotine 4 mg.  Counseled on how to take Chantix and most common side effects.  Advised to limit alcohol while on Chantix.  He will let me know how he is doing

## 2021-08-04 NOTE — Patient Instructions (Addendum)
As discussed, being on Chantix may change so alcohol affects you.  I advise drinking less alcohol while on Chantix to be safe.  The below is how you take Chantix.  Please tell family , close that you are starting Chantix so they can not only support you however also make sure you do not show any unsual thoughts, behavior- this is rare however I want you to be vigilant.    Make a plan to slowly decrease smoking as you are on the chantix until your quit date. ( see below for detailed instructions).    Be mindful of  common side effects- mainly GI upset, so please TAKE with FOOD. You may also have strange dreams, however this too is less common.   Select a quit date within 7 days of starting Chantix. Goal is you should stop smoking within 8 to 35 days of starting chantix  Initial: Days 1 to 3: 0.5 mg by mouth once daily Days 4 to 7: 0.5 mg by mouth twice daily   Maintenance (? Day 8): 1 mg by mouth twice daily for 11 weeks. We may consider a temporary or permanent dose reduction if usual dose of 1 mg twice per day is not tolerated.   Slow decrease of smoking:   If you are not able or willing to quit abruptly, begin treatment with vareniciline ( Chantix) and reduce smoking by 50% from baseline within the first 4 weeks, by an additional 50% in the next 4 weeks, and continue reducing with the goal of complete abstinence by 12 weeks. If successfully quits smoking at the end of the 12 weeks, may continue for another 12 weeks to help maintain success. If you are motivated to quit and do not succeed in stopping smoking during prior therapy, or relapse after treatment, I would encourage you to make another attempt with varenicline ( Chantix) once factors contributing to the failed attempt have been identified and addressed.

## 2021-08-04 NOTE — Assessment & Plan Note (Signed)
Chronic, stable. Continue prn cialis.

## 2021-08-04 NOTE — Progress Notes (Signed)
Virtual Visit via Video Note  I connected with@  on 08/04/21 at 11:30 AM EDT by a video enabled telemedicine application and verified that I am speaking with the correct person using two identifiers.  Location patient: home Location provider:work  Persons participating in the virtual visit: patient, provider  I discussed the limitations of evaluation and management by telemedicine and the availability of in person appointments. The patient expressed understanding and agreed to proceed.   HPI: Appointment to discuss chantix, nicotine.   He quit smoking in 2022 for approx 6 months after chantix. He would like to stop again. He is currently dipping 1 can/day tobacco. No vaping. He had lucid dreams on chantix. No nausea.   Previously on 36m nicotine gum  Requests refill of cialis. No h/o MI.   ROS: See pertinent positives and negatives per HPI.    EXAM:  VITALS per patient if applicable: Ht 5' 7.9" (1.725 m)   Wt 150 lb (68 kg)   BMI 22.87 kg/m  BP Readings from Last 3 Encounters:  11/16/19 116/82  08/01/18 120/80  08/30/17 124/87   Wt Readings from Last 3 Encounters:  08/04/21 150 lb (68 kg)  11/16/19 166 lb 12.8 oz (75.7 kg)  08/01/18 156 lb (70.8 kg)    GENERAL: alert, oriented, appears well and in no acute distress  HEENT: atraumatic, conjunttiva clear, no obvious abnormalities on inspection of external nose and ears  NECK: normal movements of the head and neck  LUNGS: on inspection no signs of respiratory distress, breathing rate appears normal, no obvious gross SOB, gasping or wheezing  CV: no obvious cyanosis  MS: moves all visible extremities without noticeable abnormality  PSYCH/NEURO: pleasant and cooperative, no obvious depression or anxiety, speech and thought processing grossly intact  ASSESSMENT AND PLAN:  Discussed the following assessment and plan:  Problem List Items Addressed This Visit       Other   Erectile dysfunction    Chronic,  stable. Continue prn cialis.       Relevant Medications   tadalafil (CIALIS) 20 MG tablet   Tobacco use - Primary    Patient using smokeless tobacco, dipping.  Restart Chantix, nicotine 4 mg.  Counseled on how to take Chantix and most common side effects.  Advised to limit alcohol while on Chantix.  He will let me know how he is doing      Relevant Medications   varenicline (CHANTIX) 1 MG tablet   nicotine polacrilex (NICORETTE) 4 MG gum    -we discussed possible serious and likely etiologies, options for evaluation and workup, limitations of telemedicine visit vs in person visit, treatment, treatment risks and precautions. Pt prefers to treat via telemedicine empirically rather then risking or undertaking an in person visit at this moment.  .   I discussed the assessment and treatment plan with the patient. The patient was provided an opportunity to ask questions and all were answered. The patient agreed with the plan and demonstrated an understanding of the instructions.   The patient was advised to call back or seek an in-person evaluation if the symptoms worsen or if the condition fails to improve as anticipated.   MMable Paris FNP

## 2021-08-05 ENCOUNTER — Other Ambulatory Visit: Payer: Self-pay

## 2021-08-07 ENCOUNTER — Other Ambulatory Visit: Payer: Self-pay

## 2021-08-07 ENCOUNTER — Ambulatory Visit: Payer: 59 | Admitting: Dermatology

## 2021-08-07 DIAGNOSIS — L659 Nonscarring hair loss, unspecified: Secondary | ICD-10-CM

## 2021-08-07 DIAGNOSIS — L649 Androgenic alopecia, unspecified: Secondary | ICD-10-CM

## 2021-08-07 DIAGNOSIS — Z79899 Other long term (current) drug therapy: Secondary | ICD-10-CM | POA: Diagnosis not present

## 2021-08-07 DIAGNOSIS — L719 Rosacea, unspecified: Secondary | ICD-10-CM

## 2021-08-07 MED ORDER — MINOXIDIL 2.5 MG PO TABS
2.5000 mg | ORAL_TABLET | Freq: Every day | ORAL | 6 refills | Status: DC
  Filled 2021-08-07: qty 30, 30d supply, fill #0

## 2021-08-07 MED ORDER — DOXYCYCLINE HYCLATE 20 MG PO TABS
20.0000 mg | ORAL_TABLET | Freq: Two times a day (BID) | ORAL | 3 refills | Status: DC
  Filled 2021-08-07: qty 180, 90d supply, fill #0
  Filled 2021-11-24: qty 180, 90d supply, fill #1
  Filled 2022-02-06: qty 180, 90d supply, fill #2

## 2021-08-07 MED ORDER — MINOXIDIL 2.5 MG PO TABS
2.5000 mg | ORAL_TABLET | Freq: Every day | ORAL | 3 refills | Status: DC
  Filled 2021-08-07: qty 90, 90d supply, fill #0
  Filled 2021-11-10: qty 67, 67d supply, fill #1
  Filled 2021-11-10: qty 23, 23d supply, fill #1
  Filled 2022-02-06: qty 90, 90d supply, fill #2

## 2021-08-07 NOTE — Progress Notes (Signed)
   Follow-Up Visit   Subjective  Alexander Bishop is a 56 y.o. male who presents for the following: Follow-up (Alopecia follow up - Minoxidil 2.5 mg 1 po qd). Rosacea is doing well on oral doxycycline 20 mg twice daily.  The following portions of the chart were reviewed this encounter and updated as appropriate:   Tobacco  Allergies  Meds  Problems  Med Hx  Surg Hx  Fam Hx     Review of Systems:  No other skin or systemic complaints except as noted in HPI or Assessment and Plan.  Objective  Well appearing patient in no apparent distress; mood and affect are within normal limits.  A focused examination was performed including scalp, face. Relevant physical exam findings are noted in the Assessment and Plan.  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 Condition is symptomatic / bothersome to patient. Not to goal. Continue oral minoxidil minoxidil (LONITEN) 2.5 MG tablet - Scalp Take 1 tablet (2.5 mg total) by mouth daily.  Rosacea Face Chronic and persistent condition with duration or expected duration over one year. Condition is symptomatic / bothersome to patient.  Currently at goal.  doxycycline (PERIOSTAT) 20 MG tablet - Face Take 1 tablet (20 mg total) by mouth 2 (two) times daily. Take with food.  Long term medication management.  Patient is using long term (months to years) prescription medication  to control their dermatologic condition.  These medications require periodic monitoring to evaluate for efficacy and side effects and may require periodic laboratory monitoring.  Return in about 6 months (around 02/07/2022).  I, Ashok Cordia, CMA, am acting as scribe for Sarina Ser, MD . Documentation: I have reviewed the above documentation for accuracy and completeness, and I  agree with the above.  Sarina Ser, MD

## 2021-08-07 NOTE — Patient Instructions (Addendum)
Instructions for [facility] Medications  One or more of your medications was sent to the [facility] mail order compounding pharmacy. You will receive an email from them and can purchase the medicine through that link. It will then be mailed to your home at the address you confirmed. If for any reason you do not receive an email from them, please check your spam folder. If you still do not find the email, please let us know. [facility] phone number is [phone].     Due to recent changes in healthcare laws, you may see results of your pathology and/or laboratory studies on MyChart before the doctors have had a chance to review them. We understand that in some cases there may be results that are confusing or concerning to you. Please understand that not all results are received at the same time and often the doctors may need to interpret multiple results in order to provide you with the best plan of care or course of treatment. Therefore, we ask that you please give [place] 2 business days to thoroughly review all your results before contacting the office for clarification. Should we see a critical lab result, you will be contacted sooner.   If You Need Anything After Your Visit  If you have any questions or concerns for your doctor, please call our main line at [phone] and press option 4 to reach your doctor's medical assistant. If no one answers, please leave a voicemail as directed and we will return your call as soon as possible. Messages left after 4 pm will be answered the following business day.   You may also send [place] a message via [place]. We typically respond to MyChart messages within 1-2 business days.  For prescription refills, please ask your pharmacy to contact our office. Our fax number is [phone].  If you have an urgent issue when the clinic is closed that cannot wait until the next business day, you can page your doctor at the number below.    Please note that  while we do our best to be available for urgent issues outside of office hours, we are not available 24/7.   If you have an urgent issue and are unable to reach [place], you may choose to seek medical care at your doctor's office, retail clinic, urgent care center, or emergency room.  If you have a medical emergency, please immediately call 911 or go to the emergency department.  Pager Numbers  - Dr. Nehemiah Massed: [phone]  - Dr. Laurence Ferrari: [phone]  - Dr. Nicole Kindred: [phone]  In the event of inclement weather, please call our main line at [phone] for an update on the status of any delays or closures.  Dermatology Medication Tips: Please keep the boxes that topical medications come in in order to help keep track of the instructions about where and how to use these. Pharmacies typically print the medication instructions only on the boxes and not directly on the medication tubes.   If your medication is too expensive, please contact our office at [phone] option 4 or send [place] a message through [place].   We are unable to tell what your co-pay for medications will be in advance as this is different depending on your insurance coverage. However, we may be able to find a substitute medication at lower cost or fill out paperwork to get insurance to cover a needed medication.   If a prior authorization is required to get your medication covered by your insurance company, please  allow [place] 1-2 business days to complete this process.  Drug prices often vary depending on where the prescription is filled and some pharmacies may offer cheaper prices.  The website www.goodrx.com contains coupons for medications through different pharmacies. The prices here do not account for what the cost may be with help from insurance (it may be cheaper with your insurance), but the website can give you the price if you did not use any insurance.  - You can print the associated coupon and take it with your  prescription to the pharmacy.  - You may also stop by our office during regular business hours and pick up a GoodRx coupon card.  - If you need your prescription sent electronically to a different pharmacy, notify our office through [facility] or by phone at [phone] option 4.     Si Usted Necesita Algo Despus de Su Visita  Tambin puede enviarnos un mensaje a travs de Pharmacist, community. Por lo general respondemos a los mensajes de MyChart en el transcurso de 1 a 2 das hbiles.  Para renovar recetas, por favor pida a su farmacia que se ponga en contacto con nuestra oficina. Harland Dingwall de fax es [place] [phone].  Si tiene un asunto urgente cuando la clnica est cerrada y que no puede esperar hasta el siguiente da hbil, puede llamar/localizar a su doctor(a) al nmero que aparece a continuacin.   Por favor, tenga en cuenta que aunque hacemos todo lo posible para estar disponibles para asuntos urgentes fuera del horario de [place], no estamos disponibles las 24 horas del da, los 7 das de la [place].   Si tiene un problema urgente y no puede comunicarse con nosotros, puede optar por buscar atencin mdica  en el consultorio de su doctor(a), en una clnica privada, en un centro de atencin urgente o en una sala de emergencias.  Si tiene Engineering geologist, por favor llame inmediatamente al 911 o vaya a la sala de emergencias.  Nmeros de bper  - Dr. Nehemiah Massed: [phone]  - Dra. Moye: [phone]  - Dra. Nicole Kindred: [phone]  En caso de inclemencias del [place], por favor llame a Johnsie Kindred principal al [phone] para una actualizacin sobre el [place] de cualquier retraso o cierre.  Consejos para la medicacin en dermatologa: Por favor, guarde las cajas en las que vienen los medicamentos de uso tpico para ayudarle a seguir las instrucciones sobre dnde y cmo usarlos. Las farmacias generalmente imprimen las instrucciones del medicamento slo en las cajas y no  directamente en los tubos del [place].   Si su medicamento es muy caro, por favor, pngase en contacto con Zigmund Daniel llamando al [phone] y presione la opcin 4 o envenos un mensaje a travs de Pharmacist, community.   No podemos decirle cul ser su copago por los medicamentos por adelantado ya que esto es diferente dependiendo de la cobertura de su seguro. Sin embargo, es posible que podamos encontrar un medicamento sustituto a Electrical engineer un formulario para que el seguro cubra el medicamento que se considera necesario.   Si se requiere una autorizacin previa para que su compaa de seguros [place] su medicamento, por favor permtanos de 1 a 2 das hbiles para completar este proceso.  Los precios de los medicamentos varan con frecuencia dependiendo del Environmental consultant de dnde se surte la receta y alguna farmacias pueden ofrecer precios ms baratos.  El sitio web www.goodrx.com tiene cupones para medicamentos de Airline pilot. Los precios aqu no tienen en cuenta lo que  podra costar con la ayuda del seguro (puede ser ms barato con su seguro), pero el sitio web puede darle el precio si no Field seismologist.  - Puede imprimir el cupn correspondiente y llevarlo con su receta a la farmacia.  - Tambin puede pasar por nuestra oficina durante el horario de atencin regular y Charity fundraiser una tarjeta de cupones de GoodRx.  - Si necesita que su receta se enve electrnicamente a una farmacia diferente, informe a nuestra oficina a travs de MyChart de [place] o por telfono llamando al [phone] y presione la opcin 4.

## 2021-08-08 ENCOUNTER — Other Ambulatory Visit: Payer: Self-pay

## 2021-08-16 ENCOUNTER — Encounter: Payer: Self-pay | Admitting: Dermatology

## 2021-09-05 ENCOUNTER — Other Ambulatory Visit: Payer: Self-pay

## 2021-09-15 ENCOUNTER — Other Ambulatory Visit: Payer: Self-pay | Admitting: Family

## 2021-09-15 ENCOUNTER — Encounter: Payer: Self-pay | Admitting: Family

## 2021-09-15 ENCOUNTER — Ambulatory Visit (INDEPENDENT_AMBULATORY_CARE_PROVIDER_SITE_OTHER): Payer: 59 | Admitting: Family

## 2021-09-15 ENCOUNTER — Telehealth: Payer: Self-pay | Admitting: Family

## 2021-09-15 VITALS — BP 116/72 | Temp 97.8°F | Ht 68.0 in | Wt 151.8 lb

## 2021-09-15 DIAGNOSIS — Z0000 Encounter for general adult medical examination without abnormal findings: Secondary | ICD-10-CM

## 2021-09-15 DIAGNOSIS — Z23 Encounter for immunization: Secondary | ICD-10-CM

## 2021-09-15 DIAGNOSIS — K5090 Crohn's disease, unspecified, without complications: Secondary | ICD-10-CM

## 2021-09-15 DIAGNOSIS — Z125 Encounter for screening for malignant neoplasm of prostate: Secondary | ICD-10-CM | POA: Diagnosis not present

## 2021-09-15 DIAGNOSIS — R972 Elevated prostate specific antigen [PSA]: Secondary | ICD-10-CM

## 2021-09-15 LAB — LIPID PANEL
Cholesterol: 142 mg/dL (ref 0–200)
HDL: 60.7 mg/dL (ref >39.00)
LDL Cholesterol: 71 mg/dL (ref 0–99)
NonHDL: 80.98
Total CHOL/HDL Ratio: 2
Triglycerides: 52 mg/dL (ref 0.0–149.0)
VLDL: 10.4 mg/dL (ref 0.0–40.0)

## 2021-09-15 LAB — CBC WITH DIFFERENTIAL/PLATELET
Basophils Absolute: 0 10*3/uL (ref 0.0–0.1)
Basophils Relative: 0.5 % (ref 0.0–3.0)
Eosinophils Absolute: 0.1 10*3/uL (ref 0.0–0.7)
Eosinophils Relative: 1.5 % (ref 0.0–5.0)
HCT: 47 % (ref 39.0–52.0)
Hemoglobin: 16.2 g/dL (ref 13.0–17.0)
Lymphocytes Relative: 19.2 % (ref 12.0–46.0)
Lymphs Abs: 1.5 10*3/uL (ref 0.7–4.0)
MCHC: 34.5 g/dL (ref 30.0–36.0)
MCV: 91.1 fl (ref 78.0–100.0)
Monocytes Absolute: 0.7 10*3/uL (ref 0.1–1.0)
Monocytes Relative: 8.6 % (ref 3.0–12.0)
Neutro Abs: 5.4 10*3/uL (ref 1.4–7.7)
Neutrophils Relative %: 70.2 % (ref 43.0–77.0)
Platelets: 268 10*3/uL (ref 150.0–400.0)
RBC: 5.16 Mil/uL (ref 4.22–5.81)
RDW: 13.6 % (ref 11.5–15.5)
WBC: 7.7 10*3/uL (ref 4.0–10.5)

## 2021-09-15 LAB — TSH: TSH: 0.96 u[IU]/mL (ref 0.35–5.50)

## 2021-09-15 LAB — COMPREHENSIVE METABOLIC PANEL
ALT: 15 U/L (ref 0–53)
AST: 19 U/L (ref 0–37)
Albumin: 4.2 g/dL (ref 3.5–5.2)
Alkaline Phosphatase: 64 U/L (ref 39–117)
BUN: 13 mg/dL (ref 6–23)
CO2: 25 mEq/L (ref 19–32)
Calcium: 8.9 mg/dL (ref 8.4–10.5)
Chloride: 104 mEq/L (ref 96–112)
Creatinine, Ser: 1.28 mg/dL (ref 0.40–1.50)
GFR: 63.41 mL/min (ref >60.00)
Glucose, Bld: 85 mg/dL (ref 70–99)
Potassium: 4 mEq/L (ref 3.5–5.1)
Sodium: 137 mEq/L (ref 135–145)
Total Bilirubin: 0.9 mg/dL (ref 0.2–1.2)
Total Protein: 6.6 g/dL (ref 6.0–8.3)

## 2021-09-15 LAB — PSA: PSA: 1.12 ng/mL (ref 0.10–4.00)

## 2021-09-15 LAB — HEMOGLOBIN A1C: Hgb A1c MFr Bld: 4.8 % (ref 4.6–6.5)

## 2021-09-15 LAB — VITAMIN D 25 HYDROXY (VIT D DEFICIENCY, FRACTURES): VITD: 37.67 ng/mL (ref 30.00–100.00)

## 2021-09-15 NOTE — Patient Instructions (Signed)
Nice to see you Health Maintenance, Male Adopting a healthy lifestyle and getting preventive care are important in promoting health and wellness. Ask your health care provider about: The right schedule for you to have regular tests and exams. Things you can do on your own to prevent diseases and keep yourself healthy. What should I know about diet, weight, and exercise? Eat a healthy diet  Eat a diet that includes plenty of vegetables, fruits, low-fat dairy products, and lean protein. Do not eat a lot of foods that are high in solid fats, added sugars, or sodium. Maintain a healthy weight Body mass index (BMI) is a measurement that can be used to identify possible weight problems. It estimates body fat based on height and weight. Your health care provider can help determine your BMI and help you achieve or maintain a healthy weight. Get regular exercise Get regular exercise. This is one of the most important things you can do for your health. Most adults should: Exercise for at least 150 minutes each week. The exercise should increase your heart rate and make you sweat (moderate-intensity exercise). Do strengthening exercises at least twice a week. This is in addition to the moderate-intensity exercise. Spend less time sitting. Even light physical activity can be beneficial. Watch cholesterol and blood lipids Have your blood tested for lipids and cholesterol at 54 years of age, then have this test every 5 years. You may need to have your cholesterol levels checked more often if: Your lipid or cholesterol levels are high. You are older than 54 years of age. You are at high risk for heart disease. What should I know about cancer screening? Many types of cancers can be detected early and may often be prevented. Depending on your health history and family history, you may need to have cancer screening at various ages. This may include screening for: Colorectal cancer. Prostate cancer. Skin  cancer. Lung cancer. What should I know about heart disease, diabetes, and high blood pressure? Blood pressure and heart disease High blood pressure causes heart disease and increases the risk of stroke. This is more likely to develop in people who have high blood pressure readings or are overweight. Talk with your health care provider about your target blood pressure readings. Have your blood pressure checked: Every 3-5 years if you are 10-63 years of age. Every year if you are 97 years old or older. If you are between the ages of 6 and 71 and are a current or former smoker, ask your health care provider if you should have a one-time screening for abdominal aortic aneurysm (AAA). Diabetes Have regular diabetes screenings. This checks your fasting blood sugar level. Have the screening done: Once every three years after age 21 if you are at a normal weight and have a low risk for diabetes. More often and at a younger age if you are overweight or have a high risk for diabetes. What should I know about preventing infection? Hepatitis B If you have a higher risk for hepatitis B, you should be screened for this virus. Talk with your health care provider to find out if you are at risk for hepatitis B infection. Hepatitis C Blood testing is recommended for: Everyone born from [birth year] through 1963. Anyone with known risk factors for hepatitis C. Sexually transmitted infections (STIs) You should be screened each year for STIs, including gonorrhea and chlamydia, if: You are sexually active and are younger than 54 years of age. You are older than 54 years  of age and your health care provider tells you that you are at risk for this type of infection. Your sexual activity has changed since you were last screened, and you are at increased risk for chlamydia or gonorrhea. Ask your health care provider if you are at risk. Ask your health care provider about whether you are at high risk for HIV. Your health  care provider may recommend a prescription medicine to help prevent HIV infection. If you choose to take medicine to prevent HIV, you should first get tested for HIV. You should then be tested every 3 months for as long as you are taking the medicine. Follow these instructions at home: Alcohol use Do not drink alcohol if your health care provider tells you not to drink. If you drink alcohol: Limit how much you have to 0-2 drinks a day. Know how much alcohol is in your drink. In the [place] one drink equals one 12 oz bottle of beer (355 mL), one 5 oz glass of wine (148 mL), or one 1 oz glass of hard liquor (44 mL). Lifestyle Do not use any products that contain nicotine or tobacco. These products include cigarettes, chewing tobacco, and vaping devices, such as e-cigarettes. If you need help quitting, ask your health care provider. Do not use street drugs. Do not share needles. Ask your health care provider for help if you need support or information about quitting drugs. General instructions Schedule regular health, dental, and eye exams. Stay current with your vaccines. Tell your health care provider if: You often feel depressed. You have ever been abused or do not feel safe at home. Summary Adopting a healthy lifestyle and getting preventive care are important in promoting health and wellness. Follow your health care provider's instructions about healthy diet, exercising, and getting tested or screened for diseases. Follow your health care provider's instructions on monitoring your cholesterol and blood pressure. This information is not intended to replace advice given to you by your health care provider. Make sure you discuss any questions you have with your health care provider. Document Revised: 05/27/2020 Document Reviewed: 05/27/2020 Elsevier Patient Education  [place].

## 2021-09-15 NOTE — Assessment & Plan Note (Signed)
Screening labs ordered today.  Call out to Dr. Verlin Grills office to confirm when repeat colonoscopy will be due.  Encouraged walking program.  Patient has set a quit date for chewing tobacco for next month.  He declines referral for CT lung cancer screening program to see if he would qualify though I cannot find where chewing tobacco would qualify him for the program.

## 2021-09-15 NOTE — Progress Notes (Signed)
WANTS SHINGLES VACINE

## 2021-09-15 NOTE — Progress Notes (Signed)
Subjective:    Patient ID: Alexander Bishop, male    DOB: 1965-08-06, 56 y.o.   MRN: [id number]  CC: Alexander Bishop is a 56 y.o. male who presents today for physical exam.    HPI: Feels well today No complaints  No recent flare of Crohn's disease.  Bowel habits are normal.  Denies constipation.     Follows with dermatology, Dr. Bea Laura Colorectal  Cancer Screening: UTD , Dr Marius Ditch,  Prostate Cancer Screening: No trouble urinating, decreased urine stream.   Lung Cancer Screening: he declines screening . He also uses dip tobacco. He doesn't smoke cigarrettes  AAA screen for all men and women aged 31 to 70 with h/o tobacco, men 5 years older with family h/o AAA and women 50 years or older who have ever smoked or have family history of AAA.  Immunizations       Tetanus -         Shingrex - Candidate for.   Labs: Screening labs today. Exercise: Some regular exercise with golf Alcohol use:  8 beers on the weekends.  Smoking/tobacco use: dips tobacco; plans to quit 09/19/21   HISTORY:  Past Medical History:  Diagnosis Date   Allergy    Crohn disease ([place])    Dysplastic nevus 10/11/2007   L med inf scapular area - mild   History of chicken pox    Rosacea     Past Surgical History:  Procedure Laterality Date   BOWEL RESECTION  1993   COLONOSCOPY     COLONOSCOPY WITH PROPOFOL N/A 12/03/2014   Procedure: COLONOSCOPY WITH PROPOFOL;  Surgeon: Lollie Sails, MD;  Location[facility] ENDOSCOPY;  Service: Endoscopy;  Laterality: N/A;   COLONOSCOPY WITH PROPOFOL N/A 08/30/2017   Procedure: COLONOSCOPY WITH PROPOFOL;  Surgeon: Lin Landsman, MD;  Location: [facility] ENDOSCOPY;  Service: Gastroenterology;  Laterality: N/A;   Family History  Problem Relation Age of Onset   Hyperlipidemia Father    Heart disease Father    Arthritis Paternal Grandmother    Arthritis Paternal Grandfather       ALLERGIES: Patient has no known allergies.  Current Outpatient Medications on  File Prior to Visit  Medication Sig Dispense Refill   doxycycline (PERIOSTAT) 20 MG tablet Take 1 tablet (20 mg total) by mouth 2 (two) times daily. Take with food. 180 tablet 3   minoxidil (LONITEN) 2.5 MG tablet Take 1 tablet (2.5 mg total) by mouth daily. 90 tablet 3   nicotine polacrilex (NICORETTE) 4 MG gum Chew 1 piece of gum (4 mg total) by mouth as directed. 110 each 5   tadalafil (CIALIS) 20 MG tablet TAKE 1/2 TABLET BY MOUTH EVERY OTHER DAY AS NEEDED FOR ERECTILE DYSFUNCTION. MAX 1 TABLET PER 24HRS. MAY LAST 36 HRS. 6 tablet 5   varenicline (CHANTIX) 1 MG tablet Take 0.5 tablet daily x 3 days, then 0.5 tablets BID x 4 days, then 1 tablet BID thereafter 56 tablet 2   cyanocobalamin (,VITAMIN B-12,) 1000 MCG/ML injection Inject 1000 mcg every 30 days into skin IM for B-12 deficiency. (Patient not taking: Reported on 09/15/2021) 10 mL 0   ketoconazole (NIZORAL) 2 % shampoo Apply 1 application topically 2 (two) times a week. Apply to trunk let sit for 5 minutes and rinse off, do this 2 times weekly (Patient not taking: Reported on 09/15/2021) 120 mL 4   nicotine polacrilex (NICORETTE) 2 MG gum Take 2 mg by mouth as needed for smoking cessation. (Patient not taking: Reported  on 08/04/2021)     SYRINGE-NEEDLE, DISP, 3 ML 25G X 1" 3 ML MISC Used to give monthly B-12 injections. (Patient not taking: Reported on 08/04/2021) 50 each 2   No current facility-administered medications on file prior to visit.    Social History   Tobacco Use   Smoking status: Never   Smokeless tobacco: Former    Types: Snuff, Chew    Quit date: 02/11/2020   Tobacco comments:    Grizzly wintergreen Long cut; 1 can/day.   Substance Use Topics   Alcohol use: Yes    Alcohol/week: 0.0 standard drinks of alcohol    Comment: Drinks ~ 8 beers on the weekends.    Drug use: No    Review of Systems  Constitutional:  Negative for chills and fever.  HENT:  Negative for congestion, ear pain, rhinorrhea, sinus pressure and  sore throat.   Respiratory:  Negative for cough, shortness of breath and wheezing.   Cardiovascular:  Negative for chest pain and palpitations.  Gastrointestinal:  Negative for constipation, diarrhea, nausea and vomiting.  Genitourinary:  Negative for difficulty urinating and dysuria.  Musculoskeletal:  Negative for myalgias.  Skin:  Negative for rash.  Neurological:  Negative for headaches.  Hematological:  Negative for adenopathy.      Objective:    BP 116/72 (BP Location: Left Arm, Patient Position: Sitting, Cuff Size: Normal)   Temp 97.8 F (36.6 C) (Oral)   Ht 5' 8"  (1.727 m)   Wt 151 lb 12.8 oz (68.9 kg)   SpO2 99%   BMI 23.08 kg/m   BP Readings from Last 3 Encounters:  09/15/21 116/72  11/16/19 116/82  08/01/18 120/80   Wt Readings from Last 3 Encounters:  09/15/21 151 lb 12.8 oz (68.9 kg)  08/04/21 150 lb (68 kg)  11/16/19 166 lb 12.8 oz (75.7 kg)    Physical Exam Vitals reviewed.  Constitutional:      Appearance: Normal appearance. He is well-developed.  Neck:     Thyroid: No thyroid mass or thyromegaly.  Cardiovascular:     Rate and Rhythm: Regular rhythm.     Heart sounds: Normal heart sounds.  Pulmonary:     Effort: Pulmonary effort is normal. No respiratory distress.     Breath sounds: Normal breath sounds. No wheezing, rhonchi or rales.  Abdominal:     General: Bowel sounds are normal. There is no distension.     Palpations: Abdomen is soft. Abdomen is not rigid. There is no fluid wave or mass.     Tenderness: There is no abdominal tenderness. There is no guarding or rebound. Negative signs include Murphy's sign and McBurney's sign.  Lymphadenopathy:     Head:     Right side of head: No submental, submandibular, tonsillar, preauricular, posterior auricular or occipital adenopathy.     Left side of head: No submental, submandibular, tonsillar, preauricular, posterior auricular or occipital adenopathy.     Cervical: No cervical adenopathy.  Skin:     General: Skin is warm and dry.  Neurological:     Mental Status: He is alert.  Psychiatric:        Speech: Speech normal.        Behavior: Behavior normal.        Assessment & Plan:   Problem List Items Addressed This Visit       Other   Annual physical exam - Primary    Screening labs ordered today.  Call out to Dr. Verlin Grills office to confirm when  repeat colonoscopy will be due.  Encouraged walking program.  Patient has set a quit date for chewing tobacco for next month.  He declines referral for [place] lung cancer screening program to see if he would qualify though I cannot find where chewing tobacco would qualify him for the program.       Relevant Orders   PSA   CBC with Differential/Platelet   Comprehensive metabolic panel   Hemoglobin A1c   Lipid panel   TSH   VITAMIN D 25 Hydroxy (Vit-D Deficiency, Fractures)   Zoster Recombinant (Shingrix )   Crohn's disease without complication (HCC)    Chronic, symptomatically stable . he declines reestablishing with gastroenterology at this time.          I am having [name] maintain his SYRINGE-NEEDLE (DISP) 3 ML, nicotine polacrilex, ketoconazole, cyanocobalamin, varenicline, nicotine polacrilex, tadalafil, minoxidil, and doxycycline.   No orders of the defined types were placed in this encounter.   Return precautions given.   Risks, benefits, and alternatives of the medications and treatment plan prescribed today were discussed, and patient expressed understanding.   Education regarding symptom management and diagnosis given to patient on AVS.   Continue to follow with Burnard Hawthorne, FNP for routine health maintenance.   [name] and I agreed with plan.   Mable Paris, FNP

## 2021-09-15 NOTE — Telephone Encounter (Signed)
Call dr Marius Ditch office GI and confirm when repeat colonoscopy is due

## 2021-09-15 NOTE — Assessment & Plan Note (Signed)
Chronic, symptomatically stable . he declines reestablishing with gastroenterology at this time.

## 2021-09-16 NOTE — Telephone Encounter (Signed)
Spoke to patient and informed him of the repeat colonoscopy in Oct 2029, Patient stated that he had seen result notes about his labs. Patient verbalized understanding and I also scheduled him for non fasting labs for 10/31/21

## 2021-09-16 NOTE — Telephone Encounter (Signed)
Called Dr Marius Ditch office and spoke to [name] and she confirmed that his Repeat Colonoscopy is not due until August 2029

## 2021-09-16 NOTE — Telephone Encounter (Signed)
Please inform pt  Also ensure he has seen my lab result note; he needs lab scheduled

## 2021-09-26 ENCOUNTER — Other Ambulatory Visit: Payer: Self-pay

## 2021-10-07 ENCOUNTER — Other Ambulatory Visit: Payer: Self-pay

## 2021-10-21 ENCOUNTER — Other Ambulatory Visit: Payer: Self-pay

## 2021-10-21 MED ORDER — NICOTINE POLACRILEX 2 MG MT GUM
CHEWING_GUM | OROMUCOSAL | 0 refills | Status: DC
  Filled 2021-10-21: qty 110, 20d supply, fill #0

## 2021-10-31 ENCOUNTER — Other Ambulatory Visit (INDEPENDENT_AMBULATORY_CARE_PROVIDER_SITE_OTHER): Payer: 59

## 2021-10-31 DIAGNOSIS — R972 Elevated prostate specific antigen [PSA]: Secondary | ICD-10-CM | POA: Diagnosis not present

## 2021-10-31 LAB — PSA: PSA: 1.1 ng/mL (ref 0.10–4.00)

## 2021-11-03 ENCOUNTER — Other Ambulatory Visit: Payer: Self-pay

## 2021-11-03 DIAGNOSIS — R972 Elevated prostate specific antigen [PSA]: Secondary | ICD-10-CM

## 2021-11-10 ENCOUNTER — Other Ambulatory Visit: Payer: Self-pay

## 2021-11-24 ENCOUNTER — Other Ambulatory Visit: Payer: Self-pay

## 2021-11-25 ENCOUNTER — Other Ambulatory Visit: Payer: Self-pay

## 2021-11-25 MED ORDER — NICOTINE POLACRILEX 2 MG MT GUM
CHEWING_GUM | OROMUCOSAL | 0 refills | Status: DC
  Filled 2021-11-25: qty 110, 30d supply, fill #0

## 2021-11-26 ENCOUNTER — Encounter: Payer: Self-pay | Admitting: Urology

## 2021-11-26 ENCOUNTER — Ambulatory Visit (INDEPENDENT_AMBULATORY_CARE_PROVIDER_SITE_OTHER): Payer: 59 | Admitting: Urology

## 2021-11-26 VITALS — BP 144/95 | HR 83 | Ht 69.0 in | Wt 155.0 lb

## 2021-11-26 DIAGNOSIS — R972 Elevated prostate specific antigen [PSA]: Secondary | ICD-10-CM | POA: Diagnosis not present

## 2021-11-26 DIAGNOSIS — N400 Enlarged prostate without lower urinary tract symptoms: Secondary | ICD-10-CM

## 2021-11-26 NOTE — Progress Notes (Unsigned)
11/26/2021 8:36 AM   Alexander Bishop 06-Sep-1965 [id number]  Referring provider: Burnard Hawthorne, FNP [address] [place],  [place] [address]  Chief Complaint  Patient presents with   Elevated PSA    HPI: Alexander Bishop is a 56 y.o. male referred for evaluation of an elevated PSA.  PSA trend:    No bothersome LUTS; occasional urgency Father was diagnosed with prostate cancer age 50, treated and still living   PMH: Past Medical History:  Diagnosis Date   Allergy    Crohn disease ([place])    Dysplastic nevus 10/11/2007   L med inf scapular area - mild   History of chicken pox    Rosacea     Surgical History: Past Surgical History:  Procedure Laterality Date   BOWEL RESECTION  1993   COLONOSCOPY     COLONOSCOPY WITH PROPOFOL N/A 12/03/2014   Procedure: COLONOSCOPY WITH PROPOFOL;  Surgeon: Lollie Sails, MD;  Location: [facility] ENDOSCOPY;  Service: Endoscopy;  Laterality: N/A;   COLONOSCOPY WITH PROPOFOL N/A 08/30/2017   Procedure: COLONOSCOPY WITH PROPOFOL;  Surgeon: Lin Landsman, MD;  Location: [facility] ENDOSCOPY;  Service: Gastroenterology;  Laterality: N/A;    Home Medications:  Allergies as of 11/26/2021   No Known Allergies      Medication List        Accurate as of November 26, 2021  8:36 AM. If you have any questions, ask your nurse or doctor.          STOP taking these medications    cyanocobalamin 1000 MCG/ML injection Commonly known as: VITAMIN B12 Stopped by: Abbie Sons, MD   ketoconazole 2 % shampoo Commonly known as: NIZORAL Stopped by: Abbie Sons, MD   SYRINGE-NEEDLE (DISP) 3 ML 25G X 1" 3 ML Misc Stopped by: Abbie Sons, MD       TAKE these medications    doxycycline 20 MG tablet Commonly known as: PERIOSTAT Take 1 tablet (20 mg total) by mouth 2 (two) times daily. Take with food.   minoxidil 2.5 MG tablet Commonly known as: LONITEN Take 1 tablet (2.5 mg total) by mouth daily.   SM  Nicotine Polacrilex 4 MG gum Generic drug: nicotine polacrilex Chew 1 piece of gum (4 mg total) by mouth as directed. What changed: Another medication with the same name was removed. Continue taking this medication, and follow the directions you see here. Changed by: Abbie Sons, MD   SM Nicotine Polacrilex 2 MG gum Generic drug: nicotine polacrilex Take as directed What changed: Another medication with the same name was removed. Continue taking this medication, and follow the directions you see here. Changed by: Abbie Sons, MD   tadalafil 20 MG tablet Commonly known as: CIALIS TAKE 1/2 TABLET BY MOUTH EVERY OTHER DAY AS NEEDED FOR ERECTILE DYSFUNCTION. MAX 1 TABLET PER 24HRS. MAY LAST 36 HRS.   varenicline 1 MG tablet Commonly known as: Chantix Take 1/2 tablet by mouth daily for 3 days, 1/2 tablet twice a day for 4 days, then one tablet twice a day thereafter (Take 0.5 tablet daily x 3 days, then 0.5 tablets BID x 4 days, then 1 tablet BID thereafter)        Allergies: No Known Allergies  Family History: Family History  Problem Relation Age of Onset   Hyperlipidemia Father    Heart disease Father    Arthritis Paternal Grandmother    Arthritis Paternal Grandfather     Social  History:  reports that he has never smoked. He quit smokeless tobacco use about 21 months ago.  His smokeless tobacco use included snuff and chew. He reports current alcohol use. He reports that he does not use drugs.   Physical Exam: BP (!) 144/95   Pulse 83   Ht 5' 9"  (1.753 m)   Wt 155 lb (70.3 kg)   BMI 22.89 kg/m   Constitutional:  Alert and oriented, No acute distress. HEENT: [place] AT getting Cardiovascular: No clubbing, cyanosis, or edema. Respiratory: Normal respiratory effort, no increased work of breathing. GU: Prostate 50 g, smooth without nodules Psychiatric: Normal mood and affect.   Assessment & Plan:    Normal PSA and change in PSA from a 0.5-1.1 in a patient without  prostate cancer is not considered significant DRE is benign Based on AUA guidelines for prostate cancer early detection PSA velocity should not be used as the sole indication for a secondary biomarker, imaging, or biopsy. Continue annual McNab, MD  [facility] [address], [place], [place] [address] [phone]

## 2021-12-01 ENCOUNTER — Encounter: Payer: Self-pay | Admitting: Urology

## 2022-02-06 ENCOUNTER — Other Ambulatory Visit: Payer: Self-pay

## 2022-02-09 ENCOUNTER — Ambulatory Visit: Payer: Commercial Managed Care - PPO | Admitting: Dermatology

## 2022-02-09 ENCOUNTER — Encounter: Payer: Self-pay | Admitting: Dermatology

## 2022-02-09 ENCOUNTER — Other Ambulatory Visit: Payer: Self-pay

## 2022-02-09 VITALS — BP 133/80 | HR 94

## 2022-02-09 DIAGNOSIS — Z79899 Other long term (current) drug therapy: Secondary | ICD-10-CM | POA: Diagnosis not present

## 2022-02-09 DIAGNOSIS — L649 Androgenic alopecia, unspecified: Secondary | ICD-10-CM | POA: Diagnosis not present

## 2022-02-09 DIAGNOSIS — L719 Rosacea, unspecified: Secondary | ICD-10-CM | POA: Diagnosis not present

## 2022-02-09 MED ORDER — DOXYCYCLINE HYCLATE 20 MG PO TABS
20.0000 mg | ORAL_TABLET | Freq: Two times a day (BID) | ORAL | 3 refills | Status: DC
  Filled 2022-02-09: qty 180, 90d supply, fill #0
  Filled 2022-07-17: qty 180, 90d supply, fill #1

## 2022-02-09 MED ORDER — MINOXIDIL 2.5 MG PO TABS
2.5000 mg | ORAL_TABLET | Freq: Every day | ORAL | 3 refills | Status: DC
  Filled 2022-02-09: qty 90, 90d supply, fill #0
  Filled 2022-05-06: qty 90, 90d supply, fill #1
  Filled 2022-08-04: qty 90, 90d supply, fill #2

## 2022-02-09 NOTE — Patient Instructions (Addendum)
Continue oral minoxidil minoxidil 2.5 MG tablet - Take 1 tablet  by mouth daily   Continue Doxycyline 20 mg twice daily with food.  Continue Skin Medicinals metronidazole/ivermectin/azelaic acid twice daily as needed to affected areas on the face.   Doxycycline should be taken with food to prevent nausea. Do not lay down for 30 minutes after taking. Be cautious with sun exposure and use good sun protection while on this medication. Pregnant women should not take this medication.    Doses of minoxidil for hair loss are considered 'low dose'. This is because the doses used for hair loss are much lower than the doses which are used for conditions such as high blood pressure (hypertension). The doses used for hypertension are 10-'40mg'$  per day.  Side effects are uncommon at the low doses (up to 2.5 mg/day) used to treat hair loss. Potential side effects, more commonly seen at higher doses, include: Increase in hair growth (hypertrichosis) elsewhere on face and body Temporary hair shedding upon starting medication which may last up to 4 weeks Ankle swelling, fluid retention, rapid weight gain more than 5 pounds Low blood pressure and feeling lightheaded or dizzy when standing up quickly Fast or irregular heartbeat Headaches    Due to recent changes in healthcare laws, you may see results of your pathology and/or laboratory studies on MyChart before the doctors have had a chance to review them. We understand that in some cases there may be results that are confusing or concerning to you. Please understand that not all results are received at the same time and often the doctors may need to interpret multiple results in order to provide you with the best plan of care or course of treatment. Therefore, we ask that you please give [place] 2 business days to thoroughly review all your results before contacting the office for clarification. Should we see a critical lab result, you will be contacted sooner.   If  You Need Anything After Your Visit  If you have any questions or concerns for your doctor, please call our main line at [phone] and press option 4 to reach your doctor's medical assistant. If no one answers, please leave a voicemail as directed and we will return your call as soon as possible. Messages left after 4 pm will be answered the following business day.   You may also send [place] a message via [place]. We typically respond to MyChart messages within 1-2 business days.  For prescription refills, please ask your pharmacy to contact our office. Our fax number is [phone].  If you have an urgent issue when the clinic is closed that cannot wait until the next business day, you can page your doctor at the number below.    Please note that while we do our best to be available for urgent issues outside of office hours, we are not available 24/7.   If you have an urgent issue and are unable to reach [place], you may choose to seek medical care at your doctor's office, retail clinic, urgent care center, or emergency room.  If you have a medical emergency, please immediately call 911 or go to the emergency department.  Pager Numbers  - Dr. Nehemiah Massed: [phone]  - Dr. Laurence Ferrari: [phone]  - Dr. Nicole Kindred: [phone]  In the event of inclement weather, please call our main line at [phone] for an update on the status of any delays or closures.  Dermatology Medication Tips: Please keep the boxes that topical medications come in in  order to help keep track of the instructions about where and how to use these. Pharmacies typically print the medication instructions only on the boxes and not directly on the medication tubes.   If your medication is too expensive, please contact our office at [phone] option 4 or send [place] a message through [address] [place].   We are unable to tell what your co-pay for medications will be in advance as this is different depending on your insurance coverage.  However, we may be able to find a substitute medication at lower cost or fill out paperwork to get insurance to cover a needed medication.   If a prior authorization is required to get your medication covered by your insurance company, please allow [place] 1-2 business days to complete this process.  Drug prices often vary depending on where the prescription is filled and some pharmacies may offer cheaper prices.  The website www.goodrx.com contains coupons for medications through different pharmacies. The prices here do not account for what the cost may be with help from insurance (it may be cheaper with your insurance), but the website can give you the price if you did not use any insurance.  - You can print the associated coupon and take it with your prescription to the pharmacy.  - You may also stop by our office during regular business hours and pick up a GoodRx coupon card.  - If you need your prescription sent electronically to a different pharmacy, notify our office through [facility] or by phone at [phone] option 4.     Si Usted Necesita Algo Despus de Su Visita  Tambin puede enviarnos un mensaje a travs de Pharmacist, community. Por lo general respondemos a los mensajes de MyChart en el transcurso de 1 a 2 das hbiles.  Para renovar recetas, por favor pida a su farmacia que se ponga en contacto con nuestra oficina. Harland Dingwall de fax es [place] [phone].  Si tiene un asunto urgente cuando la clnica est cerrada y que no puede esperar hasta el siguiente da hbil, puede llamar/localizar a su doctor(a) al nmero que aparece a continuacin.   Por favor, tenga en cuenta que aunque hacemos todo lo posible para estar disponibles para asuntos urgentes fuera del horario de [place], no estamos disponibles las 24 horas del da, los 7 das de la [place].   Si tiene un problema urgente y no puede comunicarse con nosotros, puede optar por buscar atencin mdica  en el consultorio de su  doctor(a), en una clnica privada, en un centro de atencin urgente o en una sala de emergencias.  Si tiene Engineering geologist, por favor llame inmediatamente al 911 o vaya a la sala de emergencias.  Nmeros de bper  - Dr. Nehemiah Massed: [phone]  - Dra. Moye: [phone]  - Dra. Nicole Kindred: [phone]  En caso de inclemencias del [place], por favor llame a Johnsie Kindred principal al [phone] para una actualizacin sobre el [place] de cualquier retraso o cierre.  Consejos para la medicacin en dermatologa: Por favor, guarde las cajas en las que vienen los medicamentos de uso tpico para ayudarle a seguir las instrucciones sobre dnde y cmo usarlos. Las farmacias generalmente imprimen las instrucciones del medicamento slo en las cajas y no directamente en los tubos del [place].   Si su medicamento es muy caro, por favor, pngase en contacto con Zigmund Daniel llamando al [phone] y presione la opcin 4 o envenos un mensaje a travs de Pharmacist, community.   No podemos decirle cul ser su  copago por los medicamentos por adelantado ya que esto es diferente dependiendo de la cobertura de su seguro. Sin embargo, es posible que podamos encontrar un medicamento sustituto a Electrical engineer un formulario para que el seguro cubra el medicamento que se considera necesario.   Si se requiere una autorizacin previa para que su compaa de seguros [place] su medicamento, por favor permtanos de 1 a 2 das hbiles para completar este proceso.  Los precios de los medicamentos varan con frecuencia dependiendo del Environmental consultant de dnde se surte la receta y alguna farmacias pueden ofrecer precios ms baratos.  El sitio web www.goodrx.com tiene cupones para medicamentos de Airline pilot. Los precios aqu no tienen en cuenta lo que podra costar con la ayuda del seguro (puede ser ms barato con su seguro), pero el sitio web puede darle el precio si no utiliz Research scientist (physical sciences).  - Puede imprimir el cupn  correspondiente y llevarlo con su receta a la farmacia.  - Tambin puede pasar por nuestra oficina durante el horario de atencin regular y Charity fundraiser una tarjeta de cupones de GoodRx.  - Si necesita que su receta se enve electrnicamente a una farmacia diferente, informe a nuestra oficina a travs de MyChart de [place] o por telfono llamando al [phone] y presione la opcin 4.

## 2022-02-09 NOTE — Progress Notes (Signed)
   Follow-Up Visit   Subjective  Alexander Bishop is a 57 y.o. male who presents for the following: Alopecia (6 month follow up. Recheck alopecia and rosacea. Taking Minoxidil 2.5 mg daily since 04/14/2021, and Doxycycline 20 mg daily. Tolerating treatment regimen well. Denies adverse reactions).  The following portions of the chart were reviewed this encounter and updated as appropriate:  Tobacco  Allergies  Meds  Problems  Med Hx  Surg Hx  Fam Hx     Review of Systems: No other skin or systemic complaints except as noted in HPI or Assessment and Plan.  Objective  Well appearing patient in no apparent distress; mood and affect are within normal limits.  A focused examination was performed including head, including the scalp, face, neck, nose, ears, eyelids, and lips. Relevant physical exam findings are noted in the Assessment and Plan.  face Mild macular erythema  Scalp See photos            Assessment & Plan  Rosacea face Rosacea is a chronic progressive skin condition usually affecting the face of adults, causing redness and/or acne bumps. It is treatable but not curable. It sometimes affects the eyes (ocular rosacea) as well. It may respond to topical and/or systemic medication and can flare with stress, sun exposure, alcohol, exercise, topical steroids (including hydrocortisone/cortisone 10) and some foods.  Daily application of broad spectrum spf 30+ sunscreen to face is recommended to reduce flares.  Chronic and persistent condition with duration or expected duration over one year. Condition is symptomatic / bothersome to patient.  Currently at goal. Continue Doxycyline 20 mg twice daily with food.   Long term medication management.  Patient is using long term (months to years) prescription medication  to control their dermatologic condition.  These medications require periodic monitoring to evaluate for efficacy and side effects and may require periodic  laboratory monitoring.  Continue Skin Medicinals metronidazole/ivermectin/azelaic acid twice daily as needed to affected areas on the face.   Related Medications doxycycline (PERIOSTAT) 20 MG tablet Take 1 tablet (20 mg total) by mouth 2 (two) times daily. Take with food.  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 Condition is symptomatic / bothersome to patient. Not to goal. Continue oral minoxidil minoxidil 2.5 MG tablet - Take 1 tablet  by mouth daily  Related Medications minoxidil (LONITEN) 2.5 MG tablet Take 1 tablet (2.5 mg total) by mouth daily.  Return in about 6 months (around 08/10/2022) for Alopecia Follow Up, Rosacea Follow Up, TBSE.  I, Emelia Salisbury, CMA, am acting as scribe for Sarina Ser, MD. Documentation: I have reviewed the above documentation for accuracy and completeness, and I agree with the above.  Sarina Ser, MD

## 2022-02-18 ENCOUNTER — Encounter: Payer: Self-pay | Admitting: Dermatology

## 2022-02-23 ENCOUNTER — Other Ambulatory Visit: Payer: Self-pay

## 2022-02-23 ENCOUNTER — Other Ambulatory Visit: Payer: Self-pay | Admitting: Pharmacist

## 2022-02-23 MED ORDER — NICOTINE POLACRILEX 2 MG MT GUM
CHEWING_GUM | OROMUCOSAL | 0 refills | Status: AC
  Filled 2022-02-23 (×2): qty 110, 30d supply, fill #0
  Filled 2022-02-23: qty 50, 10d supply, fill #0
  Filled 2022-09-04: qty 50, 10d supply, fill #1

## 2022-04-09 ENCOUNTER — Other Ambulatory Visit: Payer: Self-pay

## 2022-05-06 ENCOUNTER — Other Ambulatory Visit: Payer: Self-pay

## 2022-05-19 DIAGNOSIS — H524 Presbyopia: Secondary | ICD-10-CM | POA: Diagnosis not present

## 2022-07-17 ENCOUNTER — Other Ambulatory Visit: Payer: Self-pay | Admitting: Family

## 2022-07-17 ENCOUNTER — Other Ambulatory Visit: Payer: Self-pay

## 2022-07-17 DIAGNOSIS — N521 Erectile dysfunction due to diseases classified elsewhere: Secondary | ICD-10-CM

## 2022-07-17 MED ORDER — TADALAFIL 20 MG PO TABS
10.0000 mg | ORAL_TABLET | ORAL | 5 refills | Status: DC | PRN
Start: 2022-07-17 — End: 2023-05-05
  Filled 2022-07-17: qty 6, 30d supply, fill #0
  Filled 2022-09-01: qty 6, 30d supply, fill #1
  Filled 2022-10-28: qty 6, 30d supply, fill #2
  Filled 2022-12-15: qty 6, 30d supply, fill #3
  Filled 2023-01-22: qty 6, 30d supply, fill #4
  Filled 2023-03-25: qty 6, 30d supply, fill #5

## 2022-08-06 ENCOUNTER — Ambulatory Visit: Payer: Commercial Managed Care - PPO | Admitting: Dermatology

## 2022-08-19 ENCOUNTER — Encounter (INDEPENDENT_AMBULATORY_CARE_PROVIDER_SITE_OTHER): Payer: Self-pay

## 2022-09-04 ENCOUNTER — Other Ambulatory Visit: Payer: Self-pay

## 2022-09-07 ENCOUNTER — Ambulatory Visit: Payer: Commercial Managed Care - PPO | Admitting: Dermatology

## 2022-09-16 ENCOUNTER — Other Ambulatory Visit: Payer: Self-pay

## 2022-09-16 ENCOUNTER — Ambulatory Visit: Payer: Commercial Managed Care - PPO | Admitting: Dermatology

## 2022-09-16 VITALS — BP 118/82 | HR 82

## 2022-09-16 DIAGNOSIS — L649 Androgenic alopecia, unspecified: Secondary | ICD-10-CM | POA: Diagnosis not present

## 2022-09-16 DIAGNOSIS — Z79899 Other long term (current) drug therapy: Secondary | ICD-10-CM | POA: Diagnosis not present

## 2022-09-16 DIAGNOSIS — L719 Rosacea, unspecified: Secondary | ICD-10-CM | POA: Diagnosis not present

## 2022-09-16 DIAGNOSIS — L820 Inflamed seborrheic keratosis: Secondary | ICD-10-CM

## 2022-09-16 DIAGNOSIS — L219 Seborrheic dermatitis, unspecified: Secondary | ICD-10-CM

## 2022-09-16 DIAGNOSIS — Z7189 Other specified counseling: Secondary | ICD-10-CM

## 2022-09-16 MED ORDER — MINOXIDIL 2.5 MG PO TABS
2.5000 mg | ORAL_TABLET | Freq: Every day | ORAL | 3 refills | Status: DC
  Filled 2022-09-16 – 2022-10-28 (×2): qty 90, 90d supply, fill #0
  Filled 2023-02-03: qty 90, 90d supply, fill #1

## 2022-09-16 MED ORDER — DOXYCYCLINE HYCLATE 20 MG PO TABS
20.0000 mg | ORAL_TABLET | Freq: Two times a day (BID) | ORAL | 3 refills | Status: DC
  Filled 2022-09-16 – 2023-01-22 (×2): qty 180, 90d supply, fill #0
  Filled 2023-07-27: qty 180, 90d supply, fill #1

## 2022-09-16 MED ORDER — KETOCONAZOLE 2 % EX SHAM
1.0000 | MEDICATED_SHAMPOO | CUTANEOUS | 5 refills | Status: DC
  Filled 2022-09-16: qty 120, 30d supply, fill #0

## 2022-09-16 NOTE — Patient Instructions (Signed)

## 2022-09-16 NOTE — Progress Notes (Signed)
Follow-Up Visit   Subjective  Alexander Bishop is a 57 y.o. male who presents for the following: Rosacea currently using Doxycycline 20 mg po QD and tolerating well. 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 The patient has spots, moles and lesions to be evaluated, some may be new or changing and the patient may have concern these could be cancer.  The following portions of the chart were reviewed this encounter and updated as appropriate: medications, allergies, medical history  Review of Systems:  No other skin or systemic complaints except as noted in HPI or Assessment and Plan.  Objective  Well appearing patient in no apparent distress; mood and affect are within normal limits.  Areas Examined: face  Relevant physical exam findings are noted in the Assessment and Plan.  R crown scalp x 1 Erythematous stuck-on, waxy papule or plaque    Assessment & Plan   Inflamed seborrheic keratosis R crown scalp x 1  Symptomatic, irritating, patient would like treated.   Destruction of lesion - R crown scalp x 1 Complexity: simple   Destruction method: cryotherapy   Informed consent: discussed and consent obtained   Timeout:  patient name, date of birth, surgical site, and procedure verified Lesion destroyed using liquid nitrogen: Yes   Region frozen until ice ball extended beyond lesion: Yes   Outcome: patient tolerated procedure well with no complications   Post-procedure details: wound care instructions given    Rosacea  Related Medications doxycycline (PERIOSTAT) 20 MG tablet Take 1 tablet (20 mg total) by mouth 2 (two) times daily. Take with food.  Medication management  Counseling and coordination of care  Seborrheic dermatitis  Androgenetic alopecia    ROSACEA Exam:  Mid face erythema with telangiectasias +/- scattered inflammatory papules. Chronic condition with duration or expected duration over one year. Currently  well-controlled.  Rosacea is a chronic progressive skin condition usually affecting the face of adults, causing redness and/or acne bumps. It is treatable but not curable. It sometimes affects the eyes (ocular rosacea) as well. It may respond to topical and/or systemic medication and can flare with stress, sun exposure, alcohol, exercise, topical steroids (including hydrocortisone/cortisone 10) and some foods.  Daily application of broad spectrum spf 30+ sunscreen to face is recommended to reduce flares.  Treatment Plan Continue Doxycycline 20 mg po QD.   Long term medication management.  Patient is using long term (months to years) prescription medication  to control their dermatologic condition.  These medications require periodic monitoring to evaluate for efficacy and side effects.   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 Condition is symptomatic / bothersome to patient. Not to goal. Continue oral minoxidil minoxidil 2.5 MG tablet - Take 1 tablet  by mouth daily  Tinea Versicolor Chronic and persistent condition with duration or expected duration over one year. Condition is symptomatic/ bothersome to patient. Not currently at goal. Use ketoconazole shampoo as body wash once a week, especially in summer months   Tinea versicolor is a chronic recurrent skin rash causing discolored scaly spots most commonly seen on back, chest, and/or shoulders.  It is generally asymptomatic. The rash is due to overgrowth of a common type of yeast present on everyone's skin and it is not contagious.  It tends to flare more in the summer due to increased sweating on trunk.  After rash is treated, the scaliness will resolve, but the discoloration will take longer to return to  normal pigmentation. The periodic use of an OTC medicated soap/shampoo with zinc or selenium sulfide can be helpful to prevent yeast overgrowth and  recurrence.   Return in about 6 months (around 03/19/2023) for TBSE.  Maylene Roes, CMA, am acting as scribe for Armida Sans, MD .  Documentation: I have reviewed the above documentation for accuracy and completeness, and I agree with the above.  Armida Sans, MD

## 2022-09-18 ENCOUNTER — Ambulatory Visit (INDEPENDENT_AMBULATORY_CARE_PROVIDER_SITE_OTHER): Payer: Commercial Managed Care - PPO | Admitting: Family

## 2022-09-18 ENCOUNTER — Encounter: Payer: Self-pay | Admitting: Family

## 2022-09-18 ENCOUNTER — Encounter: Payer: Self-pay | Admitting: Dermatology

## 2022-09-18 VITALS — BP 126/78 | HR 81 | Temp 98.2°F | Ht 68.0 in | Wt 161.0 lb

## 2022-09-18 DIAGNOSIS — Z0000 Encounter for general adult medical examination without abnormal findings: Secondary | ICD-10-CM | POA: Diagnosis not present

## 2022-09-18 DIAGNOSIS — R972 Elevated prostate specific antigen [PSA]: Secondary | ICD-10-CM | POA: Diagnosis not present

## 2022-09-18 DIAGNOSIS — Z8249 Family history of ischemic heart disease and other diseases of the circulatory system: Secondary | ICD-10-CM

## 2022-09-18 DIAGNOSIS — Z125 Encounter for screening for malignant neoplasm of prostate: Secondary | ICD-10-CM

## 2022-09-18 LAB — CBC WITH DIFFERENTIAL/PLATELET
Basophils Absolute: 0.1 10*3/uL (ref 0.0–0.1)
Basophils Relative: 0.8 % (ref 0.0–3.0)
Eosinophils Absolute: 0.4 10*3/uL (ref 0.0–0.7)
Eosinophils Relative: 5.1 % — ABNORMAL HIGH (ref 0.0–5.0)
HCT: 44.6 % (ref 39.0–52.0)
Hemoglobin: 15.1 g/dL (ref 13.0–17.0)
Lymphocytes Relative: 26.4 % (ref 12.0–46.0)
Lymphs Abs: 1.9 10*3/uL (ref 0.7–4.0)
MCHC: 33.8 g/dL (ref 30.0–36.0)
MCV: 90.1 fl (ref 78.0–100.0)
Monocytes Absolute: 0.5 10*3/uL (ref 0.1–1.0)
Monocytes Relative: 7.3 % (ref 3.0–12.0)
Neutro Abs: 4.3 10*3/uL (ref 1.4–7.7)
Neutrophils Relative %: 60.4 % (ref 43.0–77.0)
Platelets: 268 10*3/uL (ref 150.0–400.0)
RBC: 4.95 Mil/uL (ref 4.22–5.81)
RDW: 13.5 % (ref 11.5–15.5)
WBC: 7.1 10*3/uL (ref 4.0–10.5)

## 2022-09-18 LAB — COMPREHENSIVE METABOLIC PANEL
ALT: 17 U/L (ref 0–53)
AST: 19 U/L (ref 0–37)
Albumin: 3.9 g/dL (ref 3.5–5.2)
Alkaline Phosphatase: 62 U/L (ref 39–117)
BUN: 12 mg/dL (ref 6–23)
CO2: 23 mEq/L (ref 19–32)
Calcium: 8.9 mg/dL (ref 8.4–10.5)
Chloride: 107 mEq/L (ref 96–112)
Creatinine, Ser: 1.07 mg/dL (ref 0.40–1.50)
GFR: 78.07 mL/min (ref >60.00)
Glucose, Bld: 87 mg/dL (ref 70–99)
Potassium: 3.6 mEq/L (ref 3.5–5.1)
Sodium: 138 mEq/L (ref 135–145)
Total Bilirubin: 0.8 mg/dL (ref 0.2–1.2)
Total Protein: 6.4 g/dL (ref 6.0–8.3)

## 2022-09-18 LAB — LIPID PANEL
Cholesterol: 151 mg/dL (ref 0–200)
HDL: 52.8 mg/dL (ref >39.00)
LDL Cholesterol: 81 mg/dL (ref 0–99)
NonHDL: 98.53
Total CHOL/HDL Ratio: 3
Triglycerides: 90 mg/dL (ref 0.0–149.0)
VLDL: 18 mg/dL (ref 0.0–40.0)

## 2022-09-18 LAB — PSA: PSA: 0.97 ng/mL (ref 0.10–4.00)

## 2022-09-18 LAB — TSH: TSH: 2.21 u[IU]/mL (ref 0.35–5.50)

## 2022-09-18 NOTE — Assessment & Plan Note (Signed)
Discussed family history ASCVD. Lab Results  Component Value Date   LDLCALC 71 09/15/2021   The 10-year ASCVD risk score (Lorn Butcher DK, et al., 2019) is: 3.1%  We discussed the utility of a CT calcium score to further stratify patient's ASCVD risk.  Patient will let me know if he would like to pursue

## 2022-09-18 NOTE — Patient Instructions (Signed)
We discussed  CT calcium score ( SELF PAY option)  to further stratify your overall cardiovascular risk .   An estimate of cost is $150-200 out-of-pocket as not covered by insurance. However please call your insurance company in advance to ensure no other costs so that you do not have any unexpected bills.    Let me know your thoughts  Below an article from [facility] regarding the test.   [email or web address]/exams-and-procedures/screenings/cardiac-ct#:~:[email or web address]%20cardiac%20CT%20calcium%20score,arteries%20can%20cause%20heart%20attacks.   Exams We Offer: Cardiac CT Calcium Score  Knowing your score could save your life. A cardiac CT calcium score, also known as a coronary calcium scan, is a quick, convenient and noninvasive way of evaluating the amount of calcified (hard) plaque in your heart vessels. The level of calcium equates to the extent of plaque build-up in your arteries. Plaque in the arteries can cause heart attacks.  The radiologist reads the images and sends your doctor a report with a calcium score. Patients with higher scores have a greater risk for a heart attack, heart disease or stroke. Knowing your score can help your doctor decide on blood pressure and cholesterol goals that will minimize your risk as much as possible.  The [employer] of Cardiology found that Coronary artery calcification (CAC) is an excellent cardiovascular disease risk marker and can help guide the decision to use cholesterol reducing medications such as statins. A negative calcium score may reduce the need for statins in otherwise eligible patients.  The exam takes less than 10 minutes, is painless and does not require any IV or oral contrast. At [facility] locations, the out-of-pocket fee without insurance is $75. At the time of scheduling, please let us know if you want to process the exam through your insurance or self-pay at the rate of $75. Patients  who want to self-pay should not submit their insurance card when checking in for the appointment.   Who should get a Cardiac CT Calcium Score: Middle age adults at intermediate risk of heart disease Family history of heart disease Borderline high cholesterol, high blood pressure or diabetes Overweight or physical inactivity Uncertain about taking daily preventive medical therapy  Health Maintenance, Male Adopting a healthy lifestyle and getting preventive care are important in promoting health and wellness. Ask your health care provider about: The right schedule for you to have regular tests and exams. Things you can do on your own to prevent diseases and keep yourself healthy. What should I know about diet, weight, and exercise? Eat a healthy diet  Eat a diet that includes plenty of vegetables, fruits, low-fat dairy products, and lean protein. Do not eat a lot of foods that are high in solid fats, added sugars, or sodium. Maintain a healthy weight Body mass index (BMI) is a measurement that can be used to identify possible weight problems. It estimates body fat based on height and weight. Your health care provider can help determine your BMI and help you achieve or maintain a healthy weight. Get regular exercise Get regular exercise. This is one of the most important things you can do for your health. Most adults should: Exercise for at least 150 minutes each week. The exercise should increase your heart rate and make you sweat (moderate-intensity exercise). Do strengthening exercises at least twice a week. This is in addition to the moderate-intensity exercise. Spend less time sitting. Even light physical activity can be beneficial. Watch cholesterol and blood lipids Have your blood tested for lipids and cholesterol at 55 years of age,  then have this test every 5 years. You may need to have your cholesterol levels checked more often if: Your lipid or cholesterol levels are high. You  are older than 55 years of age. You are at high risk for heart disease. What should I know about cancer screening? Many types of cancers can be detected early and may often be prevented. Depending on your health history and family history, you may need to have cancer screening at various ages. This may include screening for: Colorectal cancer. Prostate cancer. Skin cancer. Lung cancer. What should I know about heart disease, diabetes, and high blood pressure? Blood pressure and heart disease High blood pressure causes heart disease and increases the risk of stroke. This is more likely to develop in people who have high blood pressure readings or are overweight. Talk with your health care provider about your target blood pressure readings. Have your blood pressure checked: Every 3-5 years if you are 39-66 years of age. Every year if you are 37 years old or older. If you are between the ages of 17 and 54 and are a current or former smoker, ask your health care provider if you should have a one-time screening for abdominal aortic aneurysm (AAA). Diabetes Have regular diabetes screenings. This checks your fasting blood sugar level. Have the screening done: Once every three years after age 71 if you are at a normal weight and have a low risk for diabetes. More often and at a younger age if you are overweight or have a high risk for diabetes. What should I know about preventing infection? Hepatitis B If you have a higher risk for hepatitis B, you should be screened for this virus. Talk with your health care provider to find out if you are at risk for hepatitis B infection. Hepatitis C Blood testing is recommended for: Everyone born from [birth year] through 1963. Anyone with known risk factors for hepatitis C. Sexually transmitted infections (STIs) You should be screened each year for STIs, including gonorrhea and chlamydia, if: You are sexually active and are younger than 55 years of age. You are  older than 55 years of age and your health care provider tells you that you are at risk for this type of infection. Your sexual activity has changed since you were last screened, and you are at increased risk for chlamydia or gonorrhea. Ask your health care provider if you are at risk. Ask your health care provider about whether you are at high risk for HIV. Your health care provider may recommend a prescription medicine to help prevent HIV infection. If you choose to take medicine to prevent HIV, you should first get tested for HIV. You should then be tested every 3 months for as long as you are taking the medicine. Follow these instructions at home: Alcohol use Do not drink alcohol if your health care provider tells you not to drink. If you drink alcohol: Limit how much you have to 0-2 drinks a day. Know how much alcohol is in your drink. In the [place] one drink equals one 12 oz bottle of beer (355 mL), one 5 oz glass of wine (148 mL), or one 1 oz glass of hard liquor (44 mL). Lifestyle Do not use any products that contain nicotine or tobacco. These products include cigarettes, chewing tobacco, and vaping devices, such as e-cigarettes. If you need help quitting, ask your health care provider. Do not use street drugs. Do not share needles. Ask your health care provider for help  if you need support or information about quitting drugs. General instructions Schedule regular health, dental, and eye exams. Stay current with your vaccines. Tell your health care provider if: You often feel depressed. You have ever been abused or do not feel safe at home. Summary Adopting a healthy lifestyle and getting preventive care are important in promoting health and wellness. Follow your health care provider's instructions about healthy diet, exercising, and getting tested or screened for diseases. Follow your health care provider's instructions on monitoring your cholesterol and blood pressure. This  information is not intended to replace advice given to you by your health care provider. Make sure you discuss any questions you have with your health care provider. Document Revised: 05/27/2020 Document Reviewed: 05/27/2020 Elsevier Patient Education  2024 [employer].

## 2022-09-18 NOTE — Progress Notes (Signed)
Assessment & Plan:  Increased prostate specific antigen (PSA) velocity  Annual physical exam Assessment & Plan: Encouraged continued exercise.  Patient will complete Shingrix vaccine series at local pharmacy  Orders: -     CBC with Differential/Platelet -     Comprehensive metabolic panel -     Lipid panel -     PSA -     TSH  Family history of ASCVD Assessment & Plan: Discussed family history ASCVD. Lab Results  Component Value Date   LDLCALC 71 09/15/2021   The 10-year ASCVD risk score (Annalyssa Thune DK, et al., 2019) is: 3.1%  We discussed the utility of a CT calcium score to further stratify patient's ASCVD risk.  Patient will let me know if he would like to pursue      Return precautions given.   Risks, benefits, and alternatives of the medications and treatment plan prescribed today were discussed, and patient expressed understanding.   Education regarding symptom management and diagnosis given to patient on AVS either electronically or printed.  No follow-ups on file.  Rennie Plowman, FNP  Subjective:    Patient ID: Alexander Bishop, male    DOB: Jan 27, 1965, 57 y.o.   MRN: [id number]  CC: Alexander Bishop is a 57 y.o. male who presents today for physical exam.    HPI: Feels well today.  No new complaints   Seen by urology 11/2021 elevated velocity of PSA.  Reassurance provided.    Today he denies hesitancy, urinary frequency  Follows with dermatology, Dr. Avelino Leeds   Colorectal  Cancer Screening: UTD , Dr Allegra Lai in 2019; pathology negative for granulomas, dysplasia, malignancy ; repeat in 10 years;  Prostate Cancer Screening: Has been discussed with patient; patient declined following PSA based on risks versus   Lung Cancer Screening: He continues to decline screening . He also uses dip tobacco. He doesn't smoke cigarettes  He declines AAA screen.   Immunizations       Tetanus - UTD Exercise: Gets regular exercise, golf Alcohol use: Beer on  weekends Smoking/tobacco use: dips tobacco;   Health Maintenance  Topic Date Due   COVID-19 Vaccine (4 - 2023-24 season) 09/19/2021   Zoster (Shingles) Vaccine (2 of 2) 11/10/2021   Flu Shot  04/19/2023*   DTaP/Tdap/Td vaccine (2 - Td or Tdap) 04/24/2027   Colon Cancer Screening  08/31/2027   Hepatitis C Screening  Completed   HPV Vaccine  Aged Out  *Topic was postponed. The date shown is not the original due date.     ALLERGIES: Patient has no known allergies.  Current Outpatient Medications on File Prior to Visit  Medication Sig Dispense Refill   doxycycline (PERIOSTAT) 20 MG tablet Take 1 tablet (20 mg total) by mouth 2 (two) times daily. Take with food. 180 tablet 3   ketoconazole (NIZORAL) 2 % shampoo Apply 1 Application topically once a week. Shampoo onto skin and let sit for 10 minutes. Then wash off. 120 mL 5   minoxidil (LONITEN) 2.5 MG tablet Take 1 tablet (2.5 mg total) by mouth daily. 90 tablet 3   nicotine polacrilex (NICORETTE) 4 MG gum Chew 1 piece of gum (4 mg total) by mouth as directed. 110 each 5   nicotine polacrilex (SM NICOTINE POLACRILEX) 2 MG gum Take as directed 110 each 0   tadalafil (CIALIS) 20 MG tablet Take 0.5 tablets (10 mg total) by mouth every other day as needed for erectile dysfunction. Max 1 tab per day. May last 36 hours.  6 tablet 5   No current facility-administered medications on file prior to visit.    Review of Systems  Constitutional:  Negative for chills and fever.  HENT:  Negative for congestion.   Respiratory:  Negative for cough.   Cardiovascular:  Negative for chest pain, palpitations and leg swelling.  Gastrointestinal:  Negative for diarrhea, nausea and vomiting.  Genitourinary:  Negative for difficulty urinating.  Musculoskeletal:  Negative for myalgias.  Skin:  Negative for rash.  Neurological:  Negative for headaches.  Hematological:  Negative for adenopathy.  Psychiatric/Behavioral:  Negative for confusion.        Objective:    BP 126/78   Pulse 81   Temp 98.2 F (36.8 C) (Oral)   Ht 5\' 8"  (1.727 m)   Wt 161 lb (73 kg)   SpO2 98%   BMI 24.48 kg/m   BP Readings from Last 3 Encounters:  09/18/22 126/78  09/16/22 118/82  02/09/22 133/80   Wt Readings from Last 3 Encounters:  09/18/22 161 lb (73 kg)  11/26/21 155 lb (70.3 kg)  09/15/21 151 lb 12.8 oz (68.9 kg)    Physical Exam Vitals reviewed.  Constitutional:      Appearance: He is well-developed.  Neck:     Thyroid: No thyroid mass or thyromegaly.  Cardiovascular:     Rate and Rhythm: Regular rhythm.     Heart sounds: Normal heart sounds.  Pulmonary:     Effort: Pulmonary effort is normal. No respiratory distress.     Breath sounds: Normal breath sounds. No wheezing, rhonchi or rales.  Lymphadenopathy:     Head:     Right side of head: No submental, submandibular, tonsillar, preauricular, posterior auricular or occipital adenopathy.     Left side of head: No submental, submandibular, tonsillar, preauricular, posterior auricular or occipital adenopathy.     Cervical: No cervical adenopathy.  Skin:    General: Skin is warm and dry.  Neurological:     Mental Status: He is alert.  Psychiatric:        Speech: Speech normal.        Behavior: Behavior normal.

## 2022-09-18 NOTE — Assessment & Plan Note (Signed)
Encouraged continued exercise.  Patient will complete Shingrix vaccine series at local pharmacy

## 2022-09-25 ENCOUNTER — Telehealth: Payer: Self-pay

## 2022-09-25 DIAGNOSIS — R899 Unspecified abnormal finding in specimens from other organs, systems and tissues: Secondary | ICD-10-CM

## 2022-09-25 NOTE — Telephone Encounter (Signed)
Lab ordered per lab result note.  

## 2022-09-29 ENCOUNTER — Other Ambulatory Visit: Payer: Self-pay

## 2022-09-30 ENCOUNTER — Ambulatory Visit: Payer: Commercial Managed Care - PPO

## 2022-10-28 ENCOUNTER — Other Ambulatory Visit: Payer: Self-pay

## 2022-11-06 ENCOUNTER — Other Ambulatory Visit (INDEPENDENT_AMBULATORY_CARE_PROVIDER_SITE_OTHER): Payer: Commercial Managed Care - PPO

## 2022-11-06 DIAGNOSIS — R899 Unspecified abnormal finding in specimens from other organs, systems and tissues: Secondary | ICD-10-CM

## 2022-11-06 LAB — CBC WITH DIFFERENTIAL/PLATELET
Basophils Absolute: 0 10*3/uL (ref 0.0–0.1)
Basophils Relative: 0.5 % (ref 0.0–3.0)
Eosinophils Absolute: 0.5 10*3/uL (ref 0.0–0.7)
Eosinophils Relative: 5.5 % — ABNORMAL HIGH (ref 0.0–5.0)
HCT: 46.8 % (ref 39.0–52.0)
Hemoglobin: 15.9 g/dL (ref 13.0–17.0)
Lymphocytes Relative: 30.1 % (ref 12.0–46.0)
Lymphs Abs: 2.5 10*3/uL (ref 0.7–4.0)
MCHC: 34 g/dL (ref 30.0–36.0)
MCV: 89.6 fL (ref 78.0–100.0)
Monocytes Absolute: 0.6 10*3/uL (ref 0.1–1.0)
Monocytes Relative: 7.5 % (ref 3.0–12.0)
Neutro Abs: 4.8 10*3/uL (ref 1.4–7.7)
Neutrophils Relative %: 56.4 % (ref 43.0–77.0)
Platelets: 282 10*3/uL (ref 150.0–400.0)
RBC: 5.22 Mil/uL (ref 4.22–5.81)
RDW: 13.2 % (ref 11.5–15.5)
WBC: 8.4 10*3/uL (ref 4.0–10.5)

## 2022-11-06 NOTE — Addendum Note (Signed)
Addended by: [place], Arsh Feutz on: 11/06/2022 01:47 PM   Modules accepted: Orders

## 2023-01-22 ENCOUNTER — Other Ambulatory Visit: Payer: Self-pay

## 2023-03-29 ENCOUNTER — Ambulatory Visit: Admitting: Dermatology

## 2023-03-29 ENCOUNTER — Encounter: Payer: Self-pay | Admitting: Dermatology

## 2023-03-29 ENCOUNTER — Other Ambulatory Visit: Payer: Self-pay

## 2023-03-29 DIAGNOSIS — L821 Other seborrheic keratosis: Secondary | ICD-10-CM

## 2023-03-29 DIAGNOSIS — B360 Pityriasis versicolor: Secondary | ICD-10-CM | POA: Diagnosis not present

## 2023-03-29 DIAGNOSIS — L820 Inflamed seborrheic keratosis: Secondary | ICD-10-CM | POA: Diagnosis not present

## 2023-03-29 DIAGNOSIS — D1801 Hemangioma of skin and subcutaneous tissue: Secondary | ICD-10-CM | POA: Diagnosis not present

## 2023-03-29 DIAGNOSIS — D229 Melanocytic nevi, unspecified: Secondary | ICD-10-CM

## 2023-03-29 DIAGNOSIS — L578 Other skin changes due to chronic exposure to nonionizing radiation: Secondary | ICD-10-CM | POA: Diagnosis not present

## 2023-03-29 DIAGNOSIS — Z86018 Personal history of other benign neoplasm: Secondary | ICD-10-CM

## 2023-03-29 DIAGNOSIS — Z79899 Other long term (current) drug therapy: Secondary | ICD-10-CM

## 2023-03-29 DIAGNOSIS — L649 Androgenic alopecia, unspecified: Secondary | ICD-10-CM

## 2023-03-29 DIAGNOSIS — Z1283 Encounter for screening for malignant neoplasm of skin: Secondary | ICD-10-CM | POA: Diagnosis not present

## 2023-03-29 DIAGNOSIS — Z7189 Other specified counseling: Secondary | ICD-10-CM

## 2023-03-29 DIAGNOSIS — W908XXA Exposure to other nonionizing radiation, initial encounter: Secondary | ICD-10-CM

## 2023-03-29 DIAGNOSIS — L814 Other melanin hyperpigmentation: Secondary | ICD-10-CM | POA: Diagnosis not present

## 2023-03-29 MED ORDER — MINOXIDIL 2.5 MG PO TABS
2.5000 mg | ORAL_TABLET | Freq: Every day | ORAL | 3 refills | Status: AC
  Filled 2023-03-29 – 2023-05-05 (×2): qty 90, 90d supply, fill #0
  Filled 2023-07-27: qty 90, 90d supply, fill #1
  Filled 2023-11-03: qty 90, 90d supply, fill #2
  Filled 2024-01-31: qty 30, 30d supply, fill #3

## 2023-03-29 MED ORDER — KETOCONAZOLE 2 % EX SHAM
MEDICATED_SHAMPOO | CUTANEOUS | 5 refills | Status: AC
  Filled 2023-03-29: qty 120, 30d supply, fill #0

## 2023-03-29 NOTE — Progress Notes (Signed)
 Follow-Up Visit   Subjective  Alexander Bishop is a 58 y.o. male who presents for the following: Skin Cancer Screening and Full Body Skin Exam,hx of Dysplastic nevus.  Check his scalp hair loss patient taking Minoxidil tablets with a good response.  The patient presents for Total-Body Skin Exam (TBSE) for skin cancer screening and mole check. The patient has spots, moles and lesions to be evaluated, some may be new or changing and the patient may have concern these could be cancer.  The following portions of the chart were reviewed this encounter and updated as appropriate: medications, allergies, medical history  Review of Systems:  No other skin or systemic complaints except as noted in HPI or Assessment and Plan.  Objective  Well appearing patient in no apparent distress; mood and affect are within normal limits.  A full examination was performed including scalp, head, eyes, ears, nose, lips, neck, chest, axillae, abdomen, back, buttocks, bilateral upper extremities, bilateral lower extremities, hands, feet, fingers, toes, fingernails, and toenails. All findings within normal limits unless otherwise noted below.   Relevant physical exam findings are noted in the Assessment and Plan.  right crown of scalp x 1, back x 20 (21) Stuck-on, waxy, tan-brown papule or plaque --Discussed benign etiology and prognosis.      Assessment & Plan   SKIN CANCER SCREENING PERFORMED TODAY.  ACTINIC DAMAGE - Chronic condition, secondary to cumulative UV/sun exposure - diffuse scaly erythematous macules with underlying dyspigmentation - Recommend daily broad spectrum sunscreen SPF 30+ to sun-exposed areas, reapply every 2 hours as needed.  - Staying in the shade or wearing long sleeves, sun glasses (UVA+UVB protection) and wide brim hats (4-inch brim around the entire circumference of the hat) are also recommended for sun protection.  - Call for new or changing lesions.  LENTIGINES, SEBORRHEIC  KERATOSES, HEMANGIOMAS - Benign normal skin lesions - Benign-appearing - Call for any changes  MELANOCYTIC NEVI - Tan-brown and/or pink-flesh-colored symmetric macules and papules - Benign appearing on exam today - Observation - Call clinic for new or changing moles - Recommend daily use of broad spectrum spf 30+ sunscreen to sun-exposed areas.   Tinea Versicolor Back- Hyperpigmented patches with scale  Chronic and persistent condition with duration or expected duration over one year. Condition is symptomatic/ bothersome to patient. Not currently at goal. Start ketoconazole shampoo as body wash three times a week for 6 weeks, especially in summer months Tinea versicolor is a chronic recurrent skin rash causing discolored scaly spots most commonly seen on back, chest, and/or shoulders.  It is generally asymptomatic. The rash is due to overgrowth of a common type of yeast present on everyone's skin and it is not contagious.  It tends to flare more in the summer due to increased sweating on trunk.  After rash is treated, the scaliness will resolve, but the discoloration will take longer to return to normal pigmentation. The periodic use of an OTC medicated soap/shampoo with zinc or selenium sulfide can be helpful to prevent yeast overgrowth and recurrence.   Androgenetic alopecia Scalp-photos taken today  Improved compared to previous photos  Chronic and persistent condition with duration or expected duration over one year. Condition is symptomatic / bothersome to patient. Not to goal. Continue oral minoxidil minoxidil 2.5 MG tablet - Take 1 tablet  by mouth daily  Doses of oral minoxidil for hair loss are considered 'low dose'. This is because the doses used for hair loss are much lower than the doses which  are used for conditions such as high blood pressure (hypertension). The doses used for hypertension are 10-40mg  per day.  Side effects are uncommon at the low doses (up to 2.5 mg/day) used  to treat hair loss. Potential side effects, more commonly seen at higher doses, include: Increase in hair growth (hypertrichosis) elsewhere on face and body Temporary hair shedding upon starting medication which may last up to 4 weeks Ankle swelling, fluid retention, rapid weight gain more than 5 pounds Low blood pressure and feeling lightheaded or dizzy when standing up quickly Fast or irregular heartbeat Headaches   Androgenetic Alopecia (or Male pattern hair loss) refers to the common patterned hair loss affecting many men.  Male pattern alopecia is mediated by dihydrotestosterone which induces miniaturization of androgen-sensitive hair follicles.  It is chronic and persistent, but treatable; not curable. Topical treatment includes: - 5% topical Minoxidil Oral treatment includes: - Finasteride 1 mg qd - Minoxidil 1.25 - 5 mg qd - Dutasteride 0.5 mg qd Adjunct therapy includes: - Low Level Laser Light Therapy (LLLT) - Platelet-rich Plasma injections (PRP) - Hair Transplantation or scalp reduction  HISTORY OF DYSPLASTIC NEVUS No evidence of recurrence today Recommend regular full body skin exams Recommend daily broad spectrum sunscreen SPF 30+ to sun-exposed areas, reapply every 2 hours as needed.  Call if any new or changing lesions are noted between office visits   INFLAMED SEBORRHEIC KERATOSIS (21) right crown of scalp x 1, back x 20 (21) Symptomatic, irritating, patient would like treated.  Destruction of lesion - right crown of scalp x 1, back x 20 (21) Complexity: simple   Destruction method: cryotherapy   Informed consent: discussed and consent obtained   Timeout:  patient name, date of birth, surgical site, and procedure verified Lesion destroyed using liquid nitrogen: Yes   Region frozen until ice ball extended beyond lesion: Yes   Outcome: patient tolerated procedure well with no complications   Post-procedure details: wound care instructions given     Return in  about 1 year (around 03/28/2024) for TBSE, hx of Dysplastic nevus, hair loss .  IAngelique Holm, CMA, am acting as scribe for Armida Sans, MD .   Documentation: I have reviewed the above documentation for accuracy and completeness, and I agree with the above.  Armida Sans, MD

## 2023-03-29 NOTE — Patient Instructions (Addendum)

## 2023-03-31 ENCOUNTER — Ambulatory Visit: Payer: Commercial Managed Care - PPO | Admitting: Dermatology

## 2023-05-05 ENCOUNTER — Other Ambulatory Visit: Payer: Self-pay

## 2023-05-05 ENCOUNTER — Other Ambulatory Visit: Payer: Self-pay | Admitting: Family

## 2023-05-05 DIAGNOSIS — N521 Erectile dysfunction due to diseases classified elsewhere: Secondary | ICD-10-CM

## 2023-05-05 MED ORDER — TADALAFIL 20 MG PO TABS
10.0000 mg | ORAL_TABLET | ORAL | 5 refills | Status: AC | PRN
  Filled 2023-05-05: qty 6, 30d supply, fill #0
  Filled 2023-07-14: qty 6, 30d supply, fill #1
  Filled 2023-08-31: qty 6, 30d supply, fill #2
  Filled 2023-10-15: qty 6, 30d supply, fill #3
  Filled 2023-12-06: qty 6, 30d supply, fill #4
  Filled 2024-01-25: qty 6, 30d supply, fill #5

## 2023-05-31 DIAGNOSIS — H524 Presbyopia: Secondary | ICD-10-CM | POA: Diagnosis not present

## 2023-07-27 ENCOUNTER — Other Ambulatory Visit: Payer: Self-pay

## 2023-07-28 ENCOUNTER — Other Ambulatory Visit: Payer: Self-pay

## 2023-09-16 ENCOUNTER — Ambulatory Visit: Payer: Commercial Managed Care - PPO | Admitting: Family

## 2023-09-16 ENCOUNTER — Other Ambulatory Visit: Payer: Self-pay

## 2023-09-16 ENCOUNTER — Encounter: Payer: Self-pay | Admitting: Family

## 2023-09-16 VITALS — BP 120/76 | HR 93 | Temp 98.1°F | Ht 67.0 in | Wt 163.8 lb

## 2023-09-16 DIAGNOSIS — Z125 Encounter for screening for malignant neoplasm of prostate: Secondary | ICD-10-CM | POA: Diagnosis not present

## 2023-09-16 DIAGNOSIS — Z136 Encounter for screening for cardiovascular disorders: Secondary | ICD-10-CM | POA: Diagnosis not present

## 2023-09-16 DIAGNOSIS — E538 Deficiency of other specified B group vitamins: Secondary | ICD-10-CM | POA: Diagnosis not present

## 2023-09-16 DIAGNOSIS — Z13220 Encounter for screening for lipoid disorders: Secondary | ICD-10-CM | POA: Diagnosis not present

## 2023-09-16 DIAGNOSIS — Z0000 Encounter for general adult medical examination without abnormal findings: Secondary | ICD-10-CM | POA: Diagnosis not present

## 2023-09-16 LAB — COMPREHENSIVE METABOLIC PANEL WITH GFR
ALT: 17 U/L (ref 0–53)
AST: 24 U/L (ref 0–37)
Albumin: 4.2 g/dL (ref 3.5–5.2)
Alkaline Phosphatase: 61 U/L (ref 39–117)
BUN: 11 mg/dL (ref 6–23)
CO2: 24 meq/L (ref 19–32)
Calcium: 8.6 mg/dL (ref 8.4–10.5)
Chloride: 107 meq/L (ref 96–112)
Creatinine, Ser: 1.1 mg/dL (ref 0.40–1.50)
GFR: 75 mL/min (ref >60.00)
Glucose, Bld: 90 mg/dL (ref 70–99)
Potassium: 3.7 meq/L (ref 3.5–5.1)
Sodium: 140 meq/L (ref 135–145)
Total Bilirubin: 0.8 mg/dL (ref 0.2–1.2)
Total Protein: 6.6 g/dL (ref 6.0–8.3)

## 2023-09-16 LAB — CBC WITH DIFFERENTIAL/PLATELET
Basophils Absolute: 0 10*3/uL (ref 0.0–0.1)
Basophils Relative: 0.7 % (ref 0.0–3.0)
Eosinophils Absolute: 0.2 10*3/uL (ref 0.0–0.7)
Eosinophils Relative: 3.6 % (ref 0.0–5.0)
HCT: 44.9 % (ref 39.0–52.0)
Hemoglobin: 15.4 g/dL (ref 13.0–17.0)
Lymphocytes Relative: 29.1 % (ref 12.0–46.0)
Lymphs Abs: 1.6 10*3/uL (ref 0.7–4.0)
MCHC: 34.4 g/dL (ref 30.0–36.0)
MCV: 88.4 fl (ref 78.0–100.0)
Monocytes Absolute: 0.4 10*3/uL (ref 0.1–1.0)
Monocytes Relative: 7.6 % (ref 3.0–12.0)
Neutro Abs: 3.3 10*3/uL (ref 1.4–7.7)
Neutrophils Relative %: 59 % (ref 43.0–77.0)
Platelets: 250 10*3/uL (ref 150.0–400.0)
RBC: 5.08 Mil/uL (ref 4.22–5.81)
RDW: 13.2 % (ref 11.5–15.5)
WBC: 5.5 10*3/uL (ref 4.0–10.5)

## 2023-09-16 LAB — LIPID PANEL
Cholesterol: 163 mg/dL (ref 0–200)
HDL: 55.2 mg/dL (ref >39.00)
LDL Cholesterol: 87 mg/dL (ref 0–99)
NonHDL: 107.5
Total CHOL/HDL Ratio: 3
Triglycerides: 105 mg/dL (ref 0.0–149.0)
VLDL: 21 mg/dL (ref 0.0–40.0)

## 2023-09-16 LAB — PSA: PSA: 1.28 ng/mL (ref 0.10–4.00)

## 2023-09-16 LAB — B12 AND FOLATE PANEL
Folate: >22.9 ng/mL (ref >5.9)
Vitamin B-12: 136 pg/mL — ABNORMAL LOW (ref 211–911)

## 2023-09-16 MED ORDER — SYRINGE 25G X 1" 3 ML MISC
0 refills | Status: AC
  Filled 2023-09-16: qty 4, 28d supply, fill #0
  Filled 2023-09-16: qty 50, fill #0
  Filled 2023-12-06: qty 4, 28d supply, fill #1

## 2023-09-16 MED ORDER — CYANOCOBALAMIN 1000 MCG/ML IJ SOLN
INTRAMUSCULAR | 15 refills | Status: AC
  Filled 2023-09-16: qty 4, 28d supply, fill #0
  Filled 2023-12-06: qty 4, 28d supply, fill #1

## 2023-09-16 NOTE — Assessment & Plan Note (Signed)
 encouraged exercise.  Colonoscopy up-to-date.

## 2023-09-16 NOTE — Patient Instructions (Signed)
 Health Maintenance, Male  Adopting a healthy lifestyle and getting preventive care are important in promoting health and wellness. Ask your health care provider about:  The right schedule for you to have regular tests and exams.  Things you can do on your own to prevent diseases and keep yourself healthy.  What should I know about diet, weight, and exercise?  Eat a healthy diet    Eat a diet that includes plenty of vegetables, fruits, low-fat dairy products, and lean protein.  Do not eat a lot of foods that are high in solid fats, added sugars, or sodium.  Maintain a healthy weight  Body mass index (BMI) is a measurement that can be used to identify possible weight problems. It estimates body fat based on height and weight. Your health care provider can help determine your BMI and help you achieve or maintain a healthy weight.  Get regular exercise  Get regular exercise. This is one of the most important things you can do for your health. Most adults should:  Exercise for at least 150 minutes each week. The exercise should increase your heart rate and make you sweat (moderate-intensity exercise).  Do strengthening exercises at least twice a week. This is in addition to the moderate-intensity exercise.  Spend less time sitting. Even light physical activity can be beneficial.  Watch cholesterol and blood lipids  Have your blood tested for lipids and cholesterol at 56 years of age, then have this test every 5 years.  You may need to have your cholesterol levels checked more often if:  Your lipid or cholesterol levels are high.  You are older than 56 years of age.  You are at high risk for heart disease.  What should I know about cancer screening?  Many types of cancers can be detected early and may often be prevented. Depending on your health history and family history, you may need to have cancer screening at various ages. This may include screening for:  Colorectal cancer.  Prostate cancer.  Skin cancer.  Lung  cancer.  What should I know about heart disease, diabetes, and high blood pressure?  Blood pressure and heart disease  High blood pressure causes heart disease and increases the risk of stroke. This is more likely to develop in people who have high blood pressure readings or are overweight.  Talk with your health care provider about your target blood pressure readings.  Have your blood pressure checked:  Every 3-5 years if you are 24-52 years of age.  Every year if you are 3 years old or older.  If you are between the ages of 60 and 72 and are a current or former smoker, ask your health care provider if you should have a one-time screening for abdominal aortic aneurysm (AAA).  Diabetes  Have regular diabetes screenings. This checks your fasting blood sugar level. Have the screening done:  Once every three years after age 66 if you are at a normal weight and have a low risk for diabetes.  More often and at a younger age if you are overweight or have a high risk for diabetes.  What should I know about preventing infection?  Hepatitis B  If you have a higher risk for hepatitis B, you should be screened for this virus. Talk with your health care provider to find out if you are at risk for hepatitis B infection.  Hepatitis C  Blood testing is recommended for:  Everyone born from [birth year] through 1963.  Anyone  with known risk factors for hepatitis C.  Sexually transmitted infections (STIs)  You should be screened each year for STIs, including gonorrhea and chlamydia, if:  You are sexually active and are younger than 56 years of age.  You are older than 56 years of age and your health care provider tells you that you are at risk for this type of infection.  Your sexual activity has changed since you were last screened, and you are at increased risk for chlamydia or gonorrhea. Ask your health care provider if you are at risk.  Ask your health care provider about whether you are at high risk for HIV. Your health care provider  may recommend a prescription medicine to help prevent HIV infection. If you choose to take medicine to prevent HIV, you should first get tested for HIV. You should then be tested every 3 months for as long as you are taking the medicine.  Follow these instructions at home:  Alcohol use  Do not drink alcohol if your health care provider tells you not to drink.  If you drink alcohol:  Limit how much you have to 0-2 drinks a day.  Know how much alcohol is in your drink. In the [place] one drink equals one 12 oz bottle of beer (355 mL), one 5 oz glass of wine (148 mL), or one 1 oz glass of hard liquor (44 mL).  Lifestyle  Do not use any products that contain nicotine or tobacco. These products include cigarettes, chewing tobacco, and vaping devices, such as e-cigarettes. If you need help quitting, ask your health care provider.  Do not use street drugs.  Do not share needles.  Ask your health care provider for help if you need support or information about quitting drugs.  General instructions  Schedule regular health, dental, and eye exams.  Stay current with your vaccines.  Tell your health care provider if:  You often feel depressed.  You have ever been abused or do not feel safe at home.  Summary  Adopting a healthy lifestyle and getting preventive care are important in promoting health and wellness.  Follow your health care provider's instructions about healthy diet, exercising, and getting tested or screened for diseases.  Follow your health care provider's instructions on monitoring your cholesterol and blood pressure.  This information is not intended to replace advice given to you by your health care provider. Make sure you discuss any questions you have with your health care provider.  Document Revised: 05/27/2020 Document Reviewed: 05/27/2020  Elsevier Patient Education  2024 [employer].

## 2023-09-16 NOTE — Assessment & Plan Note (Signed)
 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  Pending b12. Refilled b12 IM whom his wife administers at home. Discussed potential trial of oral 1000 b12 if he would like in the future.

## 2023-09-16 NOTE — Progress Notes (Signed)
 Assessment & Plan:  Annual physical exam Assessment & Plan: encouraged exercise.  Colonoscopy up-to-date.  Orders: -     CBC with Differential/Platelet -     Comprehensive metabolic panel with GFR -     Lipid panel -     PSA -     B12 and Folate Panel -     Syringe; Use to give b12 injections  Dispense: 50 each; Refill: 0 -     Cyanocobalamin ; 1000 mcg (1 mL) intramuscular injection in the thigh ( vastus lateralis) once per week for four weeks, followed by 1000 mcg IM injection once per month.  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  Pending b12. Refilled b12 IM whom his wife administers at home. Discussed potential trial of oral 1000 b12 if he would like in the future.   Orders: -     B12 and Folate Panel -     Syringe; Use to give b12 injections  Dispense: 50 each; Refill: 0 -     Cyanocobalamin ; 1000 mcg (1 mL) intramuscular injection in the thigh ( vastus lateralis) once per week for four weeks, followed by 1000 mcg IM injection once per month.  Dispense: 1 mL; Refill: 15  Encounter for lipid screening for cardiovascular disease -     Lipid panel  Screening for prostate cancer -     PSA     Return precautions given.   Risks, benefits, and alternatives of the medications and treatment plan prescribed today were discussed, and patient expressed understanding.   Education regarding symptom management and diagnosis given to patient on AVS either electronically or printed.  Return for Complete Physical Exam.  Rollene Northern, FNP  Subjective:    Patient ID: MADSEN RIDDLE, male    DOB: 1965-08-01, 58 y.o.   MRN: [id number]  CC: MELQUISEDEC JOURNEY is a 58 y.o. male who presents today for physical exam.    HPI:  Overall feels well No new complaints  He lost his father this past year and grieves his loss.  He has been talking with friends and would like his  testosterone checked as he has noticed some fatigue  Denies depression Sleeping well  Denies cp, sob, changes in stamina to exercise.    H/o dysplastic nevus; follows with Dr Hester    Colorectal  Cancer Screening: UTD ,UTD , Dr Unk in 2019; pathology negative for granulomas, dysplasia, malignancy ; repeat in 10 years   Previously seen by urology in 2023  Lung Cancer Screening: Nonsmoker. He uses dip pouches.  No known family h/o AAA   Immunizations       Tetanus - UTD         Exercise: Gets regular exercise, golf.   Alcohol use:  beer on the weekends Smoking/tobacco use: Nonsmoker.  He uses dip pouches. Previously on chantix  with vivid dreams. He will let me know if he wants to retrial again.  Health Maintenance  Topic Date Due   Hepatitis B Vaccine (1 of 3 - 19+ 3-dose series) Never done   COVID-19 Vaccine (4 - 2024-25 season) 09/20/2022   Zoster (Shingles) Vaccine (2 of 2) 12/17/2023*   Flu Shot  04/18/2024*   Pneumococcal Vaccine for age over 20 (1 of 1 - PCV) 09/15/2024*   DTaP/Tdap/Td vaccine (2 - Td or Tdap) 04/24/2027   Colon Cancer Screening  08/31/2027  Hepatitis C Screening  Completed   HPV Vaccine  Aged Out   Meningitis B Vaccine  Aged Out  *Topic was postponed. The date shown is not the original due date.     ALLERGIES: Patient has no known allergies.  Current Outpatient Medications on File Prior to Visit  Medication Sig Dispense Refill   doxycycline  (PERIOSTAT ) 20 MG tablet Take 1 tablet (20 mg total) by mouth 2 (two) times daily. Take with food. 180 tablet 3   ketoconazole  (NIZORAL ) 2 % shampoo apply three times per week on body, leave on for 10 minutes before rinsing out for 6 weeks, then decrease to once a month 120 mL 5   minoxidil  (LONITEN ) 2.5 MG tablet Take 1 tablet (2.5 mg total) by mouth daily. 90 tablet 3   nicotine  polacrilex (NICORETTE ) 4 MG gum Chew 1 piece of gum (4 mg total) by mouth as directed. 110 each 5   nicotine  polacrilex (SM  NICOTINE  POLACRILEX) 2 MG gum Take as directed 110 each 0   tadalafil  (CIALIS ) 20 MG tablet Take 0.5 tablets (10 mg total) by mouth every other day as needed for erectile dysfunction. Max 1 tab per day. May last 36 hours. 6 tablet 5   No current facility-administered medications on file prior to visit.    Review of Systems  Constitutional:  Negative for chills and fever.  HENT:  Negative for congestion, ear pain and sore throat.   Respiratory:  Negative for cough, shortness of breath and wheezing.   Cardiovascular:  Negative for chest pain and palpitations.  Gastrointestinal:  Negative for diarrhea, nausea and vomiting.  Genitourinary:  Negative for dysuria.  Musculoskeletal:  Negative for myalgias.  Skin:  Negative for rash.  Neurological:  Negative for headaches.  Hematological:  Negative for adenopathy.  Psychiatric/Behavioral:  Negative for sleep disturbance and suicidal ideas. The patient is not nervous/anxious.       Objective:    BP 120/76   Pulse 93   Temp 98.1 F (36.7 C) (Oral)   Ht 5' 7 (1.702 m)   Wt 163 lb 12.8 oz (74.3 kg)   SpO2 99%   BMI 25.65 kg/m   BP Readings from Last 3 Encounters:  09/16/23 120/76  09/18/22 126/78  09/16/22 118/82   Wt Readings from Last 3 Encounters:  09/16/23 163 lb 12.8 oz (74.3 kg)  09/18/22 161 lb (73 kg)  11/26/21 155 lb (70.3 kg)      09/16/2023    8:30 AM 09/16/2023    8:18 AM 09/18/2022    8:35 AM  Depression screen PHQ 2/9  Decreased Interest 0 0 0  Down, Depressed, Hopeless 0 0 0  PHQ - 2 Score 0 0 0  Altered sleeping 0  0  Tired, decreased energy 0  0  Change in appetite 0  0  Feeling bad or failure about yourself  0  0  Trouble concentrating 0  0  Moving slowly or fidgety/restless 0  0  Suicidal thoughts 0  0  PHQ-9 Score 0  0  Difficult doing work/chores Not difficult at all  Not difficult at all    Physical Exam Vitals reviewed.  Constitutional:      Appearance: Normal appearance. He is well-developed.   Neck:     Thyroid: No thyroid mass or thyromegaly.  Cardiovascular:     Rate and Rhythm: Regular rhythm.     Heart sounds: Normal heart sounds.  Pulmonary:     Effort: Pulmonary effort is normal. No  respiratory distress.     Breath sounds: Normal breath sounds. No wheezing, rhonchi or rales.  Abdominal:     General: Bowel sounds are normal. There is no distension.     Palpations: Abdomen is soft. Abdomen is not rigid. There is no fluid wave or mass.     Tenderness: There is no abdominal tenderness. There is no guarding or rebound. Negative signs include Murphy's sign and McBurney's sign.  Lymphadenopathy:     Head:     Right side of head: No submental, submandibular, tonsillar, preauricular, posterior auricular or occipital adenopathy.     Left side of head: No submental, submandibular, tonsillar, preauricular, posterior auricular or occipital adenopathy.     Cervical: No cervical adenopathy.  Skin:    General: Skin is warm and dry.  Neurological:     Mental Status: He is alert.  Psychiatric:        Speech: Speech normal.        Behavior: Behavior normal.

## 2023-09-22 LAB — TESTOSTERONE, FREE & TOTAL
Free Testosterone: 110.4 pg/mL (ref 35.0–155.0)
Testosterone, Total, LC-MS-MS: 701 ng/dL (ref 250–1100)

## 2023-09-27 ENCOUNTER — Ambulatory Visit: Payer: Self-pay | Admitting: Family

## 2023-12-06 ENCOUNTER — Other Ambulatory Visit: Payer: Self-pay

## 2024-01-25 ENCOUNTER — Other Ambulatory Visit: Payer: Self-pay | Admitting: Dermatology

## 2024-01-25 ENCOUNTER — Other Ambulatory Visit: Payer: Self-pay

## 2024-01-25 DIAGNOSIS — L719 Rosacea, unspecified: Secondary | ICD-10-CM

## 2024-01-25 MED ORDER — DOXYCYCLINE HYCLATE 20 MG PO TABS
20.0000 mg | ORAL_TABLET | Freq: Two times a day (BID) | ORAL | 1 refills | Status: AC
  Filled 2024-01-25: qty 60, 30d supply, fill #0

## 2024-01-31 ENCOUNTER — Other Ambulatory Visit: Payer: Self-pay

## 2024-03-29 ENCOUNTER — Ambulatory Visit: Admitting: Dermatology

## 2024-09-15 ENCOUNTER — Encounter: Admitting: Family
